# Patient Record
Sex: Male | Born: 1950 | Race: Black or African American | Hispanic: No | Marital: Married | Smoking: Never smoker
Health system: Southern US, Community
[De-identification: ages and names within clinical notes are randomized; demographics above are authoritative.]

## PROBLEM LIST (undated history)

## (undated) DIAGNOSIS — M549 Dorsalgia, unspecified: Secondary | ICD-10-CM

## (undated) DIAGNOSIS — I10 Essential (primary) hypertension: Secondary | ICD-10-CM

## (undated) DIAGNOSIS — G8929 Other chronic pain: Secondary | ICD-10-CM

## (undated) DIAGNOSIS — I82409 Acute embolism and thrombosis of unspecified deep veins of unspecified lower extremity: Secondary | ICD-10-CM

## (undated) DIAGNOSIS — M1990 Unspecified osteoarthritis, unspecified site: Secondary | ICD-10-CM

## (undated) DIAGNOSIS — I639 Cerebral infarction, unspecified: Secondary | ICD-10-CM

## (undated) HISTORY — DX: Cerebral infarction, unspecified: I63.9

## (undated) HISTORY — PX: APPENDECTOMY: SHX54

---

## 1998-11-09 ENCOUNTER — Emergency Department (HOSPITAL_COMMUNITY): Admission: EM | Admit: 1998-11-09 | Discharge: 1998-11-09 | Payer: Self-pay | Admitting: Emergency Medicine

## 1999-02-22 ENCOUNTER — Emergency Department (HOSPITAL_COMMUNITY): Admission: EM | Admit: 1999-02-22 | Discharge: 1999-02-22 | Payer: Self-pay | Admitting: Emergency Medicine

## 1999-02-25 ENCOUNTER — Emergency Department (HOSPITAL_COMMUNITY): Admission: EM | Admit: 1999-02-25 | Discharge: 1999-02-25 | Payer: Self-pay

## 1999-04-25 ENCOUNTER — Emergency Department (HOSPITAL_COMMUNITY): Admission: EM | Admit: 1999-04-25 | Discharge: 1999-04-25 | Payer: Self-pay | Admitting: Emergency Medicine

## 1999-09-07 ENCOUNTER — Emergency Department (HOSPITAL_COMMUNITY): Admission: EM | Admit: 1999-09-07 | Discharge: 1999-09-07 | Payer: Self-pay | Admitting: Emergency Medicine

## 2000-06-18 ENCOUNTER — Emergency Department (HOSPITAL_COMMUNITY): Admission: EM | Admit: 2000-06-18 | Discharge: 2000-06-18 | Payer: Self-pay | Admitting: Emergency Medicine

## 2002-07-28 ENCOUNTER — Emergency Department (HOSPITAL_COMMUNITY): Admission: EM | Admit: 2002-07-28 | Discharge: 2002-07-28 | Payer: Self-pay | Admitting: Emergency Medicine

## 2002-08-29 ENCOUNTER — Emergency Department (HOSPITAL_COMMUNITY): Admission: EM | Admit: 2002-08-29 | Discharge: 2002-08-29 | Payer: Self-pay

## 2002-09-05 ENCOUNTER — Emergency Department (HOSPITAL_COMMUNITY): Admission: EM | Admit: 2002-09-05 | Discharge: 2002-09-05 | Payer: Self-pay | Admitting: Emergency Medicine

## 2002-09-07 ENCOUNTER — Encounter: Payer: Self-pay | Admitting: Emergency Medicine

## 2002-09-07 ENCOUNTER — Emergency Department (HOSPITAL_COMMUNITY): Admission: EM | Admit: 2002-09-07 | Discharge: 2002-09-07 | Payer: Self-pay | Admitting: Emergency Medicine

## 2003-03-13 ENCOUNTER — Emergency Department (HOSPITAL_COMMUNITY): Admission: EM | Admit: 2003-03-13 | Discharge: 2003-03-13 | Payer: Self-pay | Admitting: Emergency Medicine

## 2003-05-04 ENCOUNTER — Emergency Department (HOSPITAL_COMMUNITY): Admission: EM | Admit: 2003-05-04 | Discharge: 2003-05-04 | Payer: Self-pay | Admitting: Emergency Medicine

## 2004-06-12 ENCOUNTER — Inpatient Hospital Stay (HOSPITAL_COMMUNITY): Admission: EM | Admit: 2004-06-12 | Discharge: 2004-06-15 | Payer: Self-pay | Admitting: Emergency Medicine

## 2004-06-25 ENCOUNTER — Emergency Department (HOSPITAL_COMMUNITY): Admission: EM | Admit: 2004-06-25 | Discharge: 2004-06-25 | Payer: Self-pay | Admitting: *Deleted

## 2004-10-01 ENCOUNTER — Emergency Department (HOSPITAL_COMMUNITY): Admission: EM | Admit: 2004-10-01 | Discharge: 2004-10-01 | Payer: Self-pay | Admitting: Emergency Medicine

## 2005-06-20 ENCOUNTER — Emergency Department: Payer: Self-pay | Admitting: Emergency Medicine

## 2006-01-24 ENCOUNTER — Ambulatory Visit (HOSPITAL_COMMUNITY): Admission: RE | Admit: 2006-01-24 | Discharge: 2006-01-24 | Payer: Self-pay | Admitting: *Deleted

## 2006-04-11 ENCOUNTER — Emergency Department: Payer: Self-pay | Admitting: General Practice

## 2006-11-03 ENCOUNTER — Emergency Department: Payer: Self-pay

## 2006-12-04 ENCOUNTER — Emergency Department: Payer: Self-pay | Admitting: Emergency Medicine

## 2007-08-25 ENCOUNTER — Emergency Department (HOSPITAL_COMMUNITY): Admission: EM | Admit: 2007-08-25 | Discharge: 2007-08-25 | Payer: Self-pay | Admitting: Emergency Medicine

## 2008-02-16 ENCOUNTER — Emergency Department (HOSPITAL_COMMUNITY): Admission: EM | Admit: 2008-02-16 | Discharge: 2008-02-16 | Payer: Self-pay | Admitting: Emergency Medicine

## 2008-08-07 ENCOUNTER — Emergency Department (HOSPITAL_COMMUNITY): Admission: EM | Admit: 2008-08-07 | Discharge: 2008-08-07 | Payer: Self-pay | Admitting: Emergency Medicine

## 2009-06-28 ENCOUNTER — Emergency Department (HOSPITAL_COMMUNITY): Admission: EM | Admit: 2009-06-28 | Discharge: 2009-06-28 | Payer: Self-pay | Admitting: Emergency Medicine

## 2010-07-27 ENCOUNTER — Emergency Department (HOSPITAL_COMMUNITY): Admission: EM | Admit: 2010-07-27 | Discharge: 2010-07-27 | Payer: Self-pay | Admitting: Emergency Medicine

## 2010-08-05 ENCOUNTER — Emergency Department (HOSPITAL_COMMUNITY): Admission: EM | Admit: 2010-08-05 | Discharge: 2010-08-05 | Payer: Self-pay | Admitting: Emergency Medicine

## 2011-02-11 LAB — CBC
Hemoglobin: 14.3 g/dL (ref 13.0–17.0)
MCHC: 34.5 g/dL (ref 30.0–36.0)
MCV: 91.8 fL (ref 78.0–100.0)
Platelets: 194 10*3/uL (ref 150–400)
RBC: 4.52 MIL/uL (ref 4.22–5.81)
RDW: 13 % (ref 11.5–15.5)

## 2011-02-11 LAB — DIFFERENTIAL
Basophils Absolute: 0 10*3/uL (ref 0.0–0.1)
Eosinophils Absolute: 0.1 10*3/uL (ref 0.0–0.7)
Lymphocytes Relative: 27 % (ref 12–46)
Monocytes Absolute: 0.4 10*3/uL (ref 0.1–1.0)
Monocytes Relative: 8 % (ref 3–12)
Neutrophils Relative %: 63 % (ref 43–77)

## 2011-02-11 LAB — POCT CARDIAC MARKERS

## 2011-02-11 LAB — D-DIMER, QUANTITATIVE: D-Dimer, Quant: 0.33 ug/mL-FEU (ref 0.00–0.48)

## 2011-02-11 LAB — BASIC METABOLIC PANEL
BUN: 16 mg/dL (ref 6–23)
CO2: 23 mEq/L (ref 19–32)
Creatinine, Ser: 0.98 mg/dL (ref 0.4–1.5)
Glucose, Bld: 88 mg/dL (ref 70–99)

## 2011-03-24 NOTE — Op Note (Signed)
NAME:  ANNE, SEBRING NO.:  [id number]   MEDICAL RECORD NO.:  [id number]          PATIENT TYPE:  AMB   LOCATION:  ENDO                         FACILITY:  [facility]   PHYSICIAN:  Georgiana Spinner, M.D.    DATE OF BIRTH:  Aug 15, 1951   DATE OF PROCEDURE:  01/24/2006  DATE OF DISCHARGE:                                 OPERATIVE REPORT   PROCEDURE:  Colonoscopy.   INDICATIONS:  Colon cancer screening.   ANESTHESIA:  Fentanyl 100 mcg, Versed 5 mg.   PROCEDURE:  With the patient mildly sedated in the left lateral decubitus  position, a rectal examination was performed which was unremarkable.  Subsequently, the Olympus videoscopic colonoscope was inserted into the  rectum and passed under direct vision to the cecum, identified by ileocecal  valve and appendiceal orifice -- both of which were photographed.  From this  point, the colonoscope was slowly withdrawn, taking circumferential views of  the colonic mucosa and stopping only in the rectum which appeared normal in  direct and retroflexed view. The endoscope was straightened and withdrawn.  The patient's vital signs, pulse oximetry remained stable. The patient  tolerated the procedure well without apparent complications.   FINDINGS:  Rather unremarkable colonoscopic examination to the cecum, and  negative rectal examination to my exam.   PLAN:  Consider repeat examination in 5-10 years           ______________________________  Georgiana Spinner, M.D.     GMO/MEDQ  D:  01/24/2006  T:  01/25/2006  Job:  [id number]   cc:   Fleet Contras, M.D.  Fax: [phone]

## 2011-03-24 NOTE — Discharge Summary (Signed)
NAME:  Brent Norman, Brent Norman                          ACCOUNT NO.:  [id number]   MEDICAL RECORD NO.:  [id number]                   PATIENT TYPE:  INP   LOCATION:  4540                                 FACILITY:  [facility]   PHYSICIAN:  Jackie Plum, M.D.             DATE OF BIRTH:  1951/07/29   DATE OF ADMISSION:  06/12/2004  DATE OF DISCHARGE:  06/15/2004                                 DISCHARGE SUMMARY   DISCHARGE DIAGNOSIS:  1. Left lower extremity deep vein thrombosis.     a. Doppler ultrasound of the left lower extremity done on June 12, 2004,        remarkable for deep vein thrombosis in the mid to distal calf vein.        Remaining vessels patent.  No Baker cysts.   DISCHARGE MEDICATIONS:  1. Coumadin 5 mg p.o. q.18h. until patient seen by Dr. Concepcion Elk, whereupon     dose may be adjusted based on INR.  2. Vicodin 5/500, 1 tab q.4-6h. p.r.n. pain.   The patient is to take it easy for a couple of days.  He is to avoid going  to work until after evaluation by Dr. Concepcion Elk.  He has been counseled to  read his publication on Coumadin to familiarize himself with Coumadin and  its side effects.  I have also spent some time to counsel the patient and  his wife regarding the need to avoid any activities that may involve any  traumatic injury.  Follow up appointment will be with Dr. Concepcion Elk of [facility] on Friday, June 17, 2004, at 11:45 a.m.   CONSULTANT:  Not applicable.   PROCEDURE:  Not applicable.   CONDITION ON DISCHARGE:  Improved and satisfactory.   DISCHARGE LABORATORY DATA:  WBC 4.9, hemoglobin 14.4, hematocrit 42.9, MCV  89.7, platelet count 206, pro time 20.7, INR 2.2.  Sodium 138, potassium  3.9, chloride 107, CO2 26, glucose 90, BUN 15, creatinine 1.0, calcium 9.4.   REASON FOR HOSPITALIZATION:  Left lower extremity deep vein thrombosis.  The  patient is a 60 year old gentleman with no significant previous medical  history, who came in with right calf  pain.  He had taken some pain  medication without much help and was seen at the ED at which time ultrasound  showed DVT, and he was admitted for further treatment.  On admission,  complaint of some mild chest tightness which prompted a CT scan of the chest  to be done in the ED, which came out to be negative for any PE.  The  patient's chest tightness was very transient during his hospitalization, and  he did not have any further episodes of chest tightness while  he was in the  hospital.  Please see admission H&P by Dr. Virginia Rochester, dated June 12, 2004, for full details regarding patient's presentation.   HOSPITAL COURSE:  The patient was admitted to  the hospitalist service, was  started on Lovenox with Coumadin bridge.  Also received symptomatic  treatment for his pain.  His symptoms have improved, and his INR is  therapeutic today and is deemed appropriate for discharge today for  outpatient follow up with Dr. Concepcion Elk, at which point his INR will be  checked and Coumadin be adjusted.  On admission, no work-up for  hypocoagulability was done.  It is  not clear from the history and physical the etiology of the  patient's deep  vein thrombosis and consideration may be given to hypocoagulable work-up  after patient is off anticoagulation for about 3-6 months.  He may also be  referred to hematology for evaluation at that point.                                               Jackie Plum, M.D.    GO/MEDQ  D:  06/15/2004  T:  06/15/2004  Job:  [id number]   cc:   Fleet Contras, M.D.  [address]  [place]  [place] [address]  Fax: [phone]

## 2011-03-24 NOTE — H&P (Signed)
NAME:  Brent Norman, Brent Norman                          ACCOUNT NO.:  [id number]   MEDICAL RECORD NO.:  [id number]                   PATIENT TYPE:  EMS   LOCATION:  ED                                   FACILITY:  [facility]   PHYSICIAN:  Hollice Espy, M.D.            DATE OF BIRTH:  [birth year]   DATE OF ADMISSION:  06/12/2004  DATE OF DISCHARGE:                                HISTORY & PHYSICAL   CHIEF COMPLAINT:  Leg pain, found to have a DVT.   This is a 60 year old African-American male with no past medical history,  who tells me that he was in relatively good health when his leg,  specifically his right calf, started to ache and throb, starting Thursday,  which would be approximately three days ago.  Previously he has had no  problems with this.  He also tried taking some Tylenol, but this did not  seem to alleviate the symptoms for very long.  The patient also did note  some initial what he described as mild chest tightness on Thursday morning.  He said it was difficult for him to take a full, deep breath.  Otherwise the  pain was brief, described as just very mild, approximately 3-4/10, and  quickly went away.  He has not had any previous chest pain symptoms.  The  patient's leg continued to hurt, and finally he came to the emergency room.  His vitals were all stable.  He was saturating at 98% on room air.  He had a  left knee film done in the emergency room, which was found to have positive  for evidence of a DVT.  The patient was immediately started on heparin.  His  other lab work was unremarkable.  Currently he states that his leg was  slightly bothering him, a little sore and located in the area of the left  calf, but otherwise he has no other complaints.  He has no other complaints.  He denies any headaches, visual changes, dysphagia, chest pain, shortness of  breath, wheeze, cough, abdominal pain, hematuria, dysuria, constipation, or  diarrhea.  He denies any upper extremity pain  or weakness.   PAST MEDICAL HISTORY:  None, although he did not that he did have his  appendix taken out once.  He does not follow with a regular doctor but goes  to the ER if he needs to.   MEDICATIONS:  None.   ALLERGIES:  None.   SOCIAL HISTORY:  He denies tobacco, alcohol, or drug use.  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  The patient says that he has not had any  recent episodes of trauma.  He denies any travel history by car or by plane  trips more than just a few minutes by car.  He denies being laid up  recently.  He tells me that he has run very well and is very  active.   FAMILY HISTORY:  Positive for CAD, CVA, hypertension.   PHYSICAL EXAMINATION:  VITAL SIGNS:  The patient's vitals on admission:  Temperature 97.6, heart rate 52, respirations 16, blood pressure 121/75, O2  saturation 98% on room air.  GENERAL:  He is alert and oriented x3, in no apparent distress.  HEENT:  He is normocephalic, atraumatic.  His mucous membranes are moist.  NECK:  He has no carotid bruits.  CARDIAC:  Regular rate and rhythm, S1, S2.  CHEST: Lungs clear to auscultation bilaterally.  ABDOMEN:  Soft, nontender, nondistended, positive bowel sounds.  EXTREMITIES:  No clubbing, cyanosis, or edema.  He has good 2+ pulses.  He  has a very mild Homans sign on the left, which is quite nonspecific.   The patient's lab work:  White count 4.9 with no shift, H&H 14.4 and 42.9,  MCV of 90, platelet count of 206.  Sodium 138, potassium 3.9, chloride 107,  bicarb 26, BUN 15, creatinine 1, glucose 90, calcium 9.4.  He has a PT of  12.8, INR 1, and PTT of 26.   ASSESSMENT AND PLAN:  Deep vein thrombosis.  Unsure of etiology given that  the patient has not been on any trips, no trauma, does not sit around, he  has not been off his feet, and he has no family history of blood clots.  Will check a CT to rule out pulmonary embolus, as he was complaining of  chest tightness.  Start him on the  Coumadin and Lovenox protocol.                                               Hollice Espy, M.D.    SKK/MEDQ  D:  06/12/2004  T:  06/12/2004  Job:  [id number]

## 2011-08-01 LAB — RAPID STREP SCREEN (MED CTR MEBANE ONLY): Streptococcus, Group A Screen (Direct): POSITIVE — AB

## 2011-08-07 LAB — URINE MICROSCOPIC-ADD ON

## 2011-08-07 LAB — DIFFERENTIAL
Basophils Absolute: 0
Eosinophils Relative: 0
Lymphocytes Relative: 6 — ABNORMAL LOW
Monocytes Absolute: 0.6
Monocytes Relative: 4
Neutro Abs: 12.4 — ABNORMAL HIGH

## 2011-08-07 LAB — URINALYSIS, ROUTINE W REFLEX MICROSCOPIC
Bilirubin Urine: NEGATIVE
Nitrite: NEGATIVE
Specific Gravity, Urine: 1.034 — ABNORMAL HIGH
pH: 6

## 2011-08-07 LAB — COMPREHENSIVE METABOLIC PANEL
AST: 28
Albumin: 3.7
CO2: 24
Calcium: 9.1
Potassium: 3.8
Sodium: 139
Total Protein: 6.8

## 2011-08-07 LAB — CBC
Hemoglobin: 14
MCHC: 33.8
Platelets: 167
RBC: 4.49
RDW: 13.1

## 2013-10-07 ENCOUNTER — Ambulatory Visit: Payer: Self-pay | Admitting: General Practice

## 2015-05-18 ENCOUNTER — Encounter (HOSPITAL_COMMUNITY): Payer: Self-pay | Admitting: Emergency Medicine

## 2015-05-18 ENCOUNTER — Emergency Department (HOSPITAL_COMMUNITY)
Admission: EM | Admit: 2015-05-18 | Discharge: 2015-05-18 | Disposition: A | Discharge status: Home / Self Care | Payer: BLUE CROSS/BLUE SHIELD | Attending: Emergency Medicine | Admitting: Emergency Medicine

## 2015-05-18 DIAGNOSIS — M545 Low back pain: Secondary | ICD-10-CM | POA: Diagnosis not present

## 2015-05-18 DIAGNOSIS — M79605 Pain in left leg: Secondary | ICD-10-CM | POA: Insufficient documentation

## 2015-05-18 DIAGNOSIS — G8929 Other chronic pain: Secondary | ICD-10-CM | POA: Insufficient documentation

## 2015-05-18 DIAGNOSIS — Z79899 Other long term (current) drug therapy: Secondary | ICD-10-CM | POA: Insufficient documentation

## 2015-05-18 HISTORY — DX: Dorsalgia, unspecified: M54.9

## 2015-05-18 HISTORY — DX: Other chronic pain: G89.29

## 2015-05-18 MED ORDER — HYDROMORPHONE HCL 1 MG/ML IJ SOLN
1.0000 mg | Freq: Once | INTRAMUSCULAR | Status: AC
  Administered 2015-05-18: 1 mg via INTRAMUSCULAR
  Filled 2015-05-18: qty 1

## 2015-05-18 MED ORDER — HYDROCODONE-ACETAMINOPHEN 5-325 MG PO TABS: 1.0000 | ORAL_TABLET | Freq: Four times a day (QID) | ORAL | Status: DC | PRN

## 2015-05-18 MED ORDER — CYCLOBENZAPRINE HCL 5 MG PO TABS
5.0000 mg | ORAL_TABLET | Freq: Three times a day (TID) | ORAL | Status: DC | PRN
Start: 2015-05-18 — End: 2015-05-21

## 2015-05-18 NOTE — ED Notes (Signed)
Awake. Verbally responsive. A/O x4. Resp even and unlabored. No audible adventitious breath sounds noted. ABC's intact.  

## 2015-05-18 NOTE — ED Provider Notes (Signed)
CSN: [id number]     Arrival date & time 05/18/15  1002 History   First MD Initiated Contact with Patient 05/18/15 1011     Chief Complaint  Patient presents with  . Back Pain  . Leg Pain     Patient is a 64 y.o. male presenting with back pain and leg pain. The history is provided by the patient.  Back Pain Associated symptoms: leg pain   Leg Pain Associated symptoms: back pain    Mr. Hoon presents for evaluation of acute on chronic back and leg pain. He states that he has a history of chronic low back and left-sided back pain. Over the last 4 days he's had increased pain described as aching in nature. Pain is worse with twisting and ambulating. He has chronic tingling that radiates from his back down to his knee. It has been more intense sensation over the last couple of days. He denies any new injuries. Denies any fevers, abdominal pain, vomiting, dysuria, urinary incontinence. He has been unable to get out of bed for the last 2 days due to pain in his back. Symptoms are moderate, constant, worsening. He is taking naproxen without improvement.  Past Medical History  Diagnosis Date  . Chronic back pain    No past surgical history on file. No family history on file. History  Substance Use Topics  . Smoking status: Never Smoker   . Smokeless tobacco: Not on file  . Alcohol Use: No    Review of Systems  Musculoskeletal: Positive for back pain.  All other systems reviewed and are negative.     Allergies  Review of patient's allergies indicates no known allergies.  Home Medications   Prior to Admission medications   Medication Sig Start Date End Date Taking? Authorizing Provider  cyclobenzaprine (FLEXERIL) 5 MG tablet Take 1 tablet (5 mg total) by mouth 3 (three) times daily as needed for muscle spasms. 05/18/15   Tilden Fossa, MD  HYDROcodone-acetaminophen (NORCO/VICODIN) 5-325 MG per tablet Take 1 tablet by mouth every 6 (six) hours as needed. 05/18/15   Tilden Fossa, MD   hydrOXYzine (ATARAX/VISTARIL) 50 MG tablet Take 50 mg by mouth 2 (two) times daily. 02/12/15   Historical Provider, MD  naproxen (NAPRELAN) 500 MG 24 hr tablet Take 500 mg by mouth 2 (two) times daily as needed. 03/05/15   Historical Provider, MD   BP 146/82 mmHg  Pulse 60  Temp(Src) 98.1 F (36.7 C) (Oral)  Resp 20  SpO2 98% Physical Exam  Constitutional: He is oriented to person, place, and time. He appears well-developed and well-nourished.  HENT:  Head: Normocephalic and atraumatic.  Cardiovascular: Normal rate and regular rhythm.   No murmur heard. Pulmonary/Chest: Effort normal and breath sounds normal. No respiratory distress.  Abdominal: Soft. There is no tenderness. There is no rebound and no guarding.  Musculoskeletal: He exhibits no edema.  Tender to palpation over the left lower back, SI region. There is no midline lumbar or thoracic tenderness to palpation. No overlying rashes. Pain in the low back is reproducible with twisting and flexion of the hip. 2+ DP pulses bilaterally.  Neurological: He is alert and oriented to person, place, and time.  5/5 strength in bilateral lower extremities. Sensation light touch intact throughout bilateral lower extremities. No saddle anesthesia.  Skin: Skin is warm and dry.  Psychiatric: He has a normal mood and affect. His behavior is normal.  Nursing note and vitals reviewed.   ED Course  Procedures (including critical  care time) Labs Review Labs Reviewed - No data to display  Imaging Review No results found.   EKG Interpretation None      MDM   Final diagnoses:  Left low back pain, with sciatica presence unspecified    Pt here for evaluation of acute on chronic low back pain.  Pt with radicular sxs but no neurologic deficit on exam.  No evidence of cauda equina, acute infectious process, dissection, AAA.  Discussed home care with pain control, PCP followup, return precautions.     Tilden FossaElizabeth Kiira Brach, MD 05/18/15 1046

## 2015-05-18 NOTE — ED Notes (Signed)
Pt reported mid-back pain radiating to lt lateral leg to foot. Pt reported difficulty with weight bearing to lt leg. Denies dysuria. (+)PMS, CRT brisk, no trauma/injury/deformity or bruising noted.

## 2015-05-18 NOTE — Discharge Instructions (Signed)
Back Pain, Adult Low back pain is very common. About 1 in 5 people have back pain.The cause of low back pain is rarely dangerous. The pain often gets better over time.About half of people with a sudden onset of back pain feel better in just 2 weeks. About 8 in 10 people feel better by 6 weeks.  CAUSES Some common causes of back pain include:  Strain of the muscles or ligaments supporting the spine.  Wear and tear (degeneration) of the spinal discs.  Arthritis.  Direct injury to the back. DIAGNOSIS Most of the time, the direct cause of low back pain is not known.However, back pain can be treated effectively even when the exact cause of the pain is unknown.Answering your caregiver's questions about your overall health and symptoms is one of the most accurate ways to make sure the cause of your pain is not dangerous. If your caregiver needs more information, he or she may order lab work or imaging tests (X-rays or MRIs).However, even if imaging tests show changes in your back, this usually does not require surgery. HOME CARE INSTRUCTIONS For many people, back pain returns.Since low back pain is rarely dangerous, it is often a condition that people can learn to manageon their own.   Remain active. It is stressful on the back to sit or stand in one place. Do not sit, drive, or stand in one place for more than 30 minutes at a time. Take short walks on level surfaces as soon as pain allows.Try to increase the length of time you walk each day.  Do not stay in bed.Resting more than 1 or 2 days can delay your recovery.  Do not avoid exercise or work.Your body is made to move.It is not dangerous to be active, even though your back may hurt.Your back will likely heal faster if you return to being active before your pain is gone.  Pay attention to your body when you bend and lift. Many people have less discomfortwhen lifting if they bend their knees, keep the load close to their bodies,and  avoid twisting. Often, the most comfortable positions are those that put less stress on your recovering back.  Find a comfortable position to sleep. Use a firm mattress and lie on your side with your knees slightly bent. If you lie on your back, put a pillow under your knees.  Only take over-the-counter or prescription medicines as directed by your caregiver. Over-the-counter medicines to reduce pain and inflammation are often the most helpful.Your caregiver may prescribe muscle relaxant drugs.These medicines help dull your pain so you can more quickly return to your normal activities and healthy exercise.  Put ice on the injured area.  Put ice in a plastic bag.  Place a towel between your skin and the bag.  Leave the ice on for 15-20 minutes, 03-04 times a day for the first 2 to 3 days. After that, ice and heat may be alternated to reduce pain and spasms.  Ask your caregiver about trying back exercises and gentle massage. This may be of some benefit.  Avoid feeling anxious or stressed.Stress increases muscle tension and can worsen back pain.It is important to recognize when you are anxious or stressed and learn ways to manage it.Exercise is a great option. SEEK MEDICAL CARE IF:  You have pain that is not relieved with rest or medicine.  You have pain that does not improve in 1 week.  You have new symptoms.  You are generally not feeling well. SEEK   IMMEDIATE MEDICAL CARE IF:   You have pain that radiates from your back into your legs.  You develop new bowel or bladder control problems.  You have unusual weakness or numbness in your arms or legs.  You develop nausea or vomiting.  You develop abdominal pain.  You feel faint. Document Released: 10/23/2005 Document Revised: 04/23/2012 Document Reviewed: 02/24/2014 ExitCare Patient Information 2015 [employer]. This information is not intended to replace advice given to you by your health care provider. Make sure you  discuss any questions you have with your health care provider.  

## 2015-05-18 NOTE — ED Notes (Signed)
Pt BIB wife.  Pt states that he has chronic back problems and they started "flaring up" on Sunday.  Pt states that since then, he has had burning and tingling from his lt hip down to his toes that he states makes it difficult to walk.  Pt states "this isn't my back problem! Something else is wrong with me!"

## 2015-05-18 NOTE — ED Notes (Signed)
Pt had no reaction from IM injection.

## 2015-05-21 ENCOUNTER — Encounter (HOSPITAL_COMMUNITY): Payer: Self-pay | Admitting: Emergency Medicine

## 2015-05-21 ENCOUNTER — Emergency Department (HOSPITAL_COMMUNITY)
Admission: EM | Admit: 2015-05-21 | Discharge: 2015-05-21 | Disposition: A | Discharge status: Home / Self Care | Payer: BLUE CROSS/BLUE SHIELD | Attending: Emergency Medicine | Admitting: Emergency Medicine

## 2015-05-21 DIAGNOSIS — M5442 Lumbago with sciatica, left side: Secondary | ICD-10-CM | POA: Insufficient documentation

## 2015-05-21 DIAGNOSIS — Z79899 Other long term (current) drug therapy: Secondary | ICD-10-CM | POA: Insufficient documentation

## 2015-05-21 DIAGNOSIS — G8929 Other chronic pain: Secondary | ICD-10-CM | POA: Insufficient documentation

## 2015-05-21 DIAGNOSIS — M545 Low back pain: Secondary | ICD-10-CM | POA: Diagnosis present

## 2015-05-21 MED ORDER — HYDROCODONE-ACETAMINOPHEN 5-325 MG PO TABS: 1.0000 | ORAL_TABLET | ORAL | Status: DC | PRN

## 2015-05-21 MED ORDER — HYDROCODONE-ACETAMINOPHEN 5-325 MG PO TABS: 1.0000 | ORAL_TABLET | Freq: Four times a day (QID) | ORAL | Status: DC | PRN

## 2015-05-21 MED ORDER — CYCLOBENZAPRINE HCL 5 MG PO TABS: 5.0000 mg | ORAL_TABLET | Freq: Three times a day (TID) | ORAL | Status: DC | PRN

## 2015-05-21 NOTE — ED Notes (Signed)
Pt A+ox4, reports L lower back pain x1 week ago.  Seen here in ED x3 days ago and given medications.  Pt reports "a little" improvement with medications.  Pt continues to report "needles" in L low back.  Reports pain shooting into LLE.  Pt denies injury.  Ambulatory with slow steady gait.  Pt denies n/t to extremities.  Denies b/b changes or complaints.  Skin PWD.  MAEI.  Speakign full/clear sentences, rr even/un-lab.  NAD.

## 2015-05-21 NOTE — ED Provider Notes (Signed)
CSN: [id number]     Arrival date & time 05/21/15  1116 History   First MD Initiated Contact with Patient 05/21/15 1119     Chief Complaint  Patient presents with  . Back Pain    L lower back pain, seen here in ED for same x3 days ago, shooting down LLE     (Consider location/radiation/quality/duration/timing/severity/associated sxs/prior Treatment) HPI Comments: Patient presents today with lower back pain.  He reports that the pain has been present for the past week.  Pain radiates down the left leg.  He denies any acute injury or trauma.  Pain worse with ambulation and movement.  He was seen in the ED three days ago for the same.  At that he was given Rx for Flexeril and Norco.  He reports that he has been taking the medications as prescribed, which has helped.  He reports that he attempted to get an appointment with PCP, but was unable to get an appointment this week because his PCP was on vacation.  He states that he can get an appointment next week.  He denies bowel/bladder incontinence, fever, chills, saddle anaesthesia.  He does report chronic numbness of the LLE.    The history is provided by the patient.    Past Medical History  Diagnosis Date  . Chronic back pain    Past Surgical History  Procedure Laterality Date  . Appendectomy     No family history on file. History  Substance Use Topics  . Smoking status: Never Smoker   . Smokeless tobacco: Not on file  . Alcohol Use: No    Review of Systems  All other systems reviewed and are negative.     Allergies  Review of patient's allergies indicates no known allergies.  Home Medications   Prior to Admission medications   Medication Sig Start Date End Date Taking? Authorizing Provider  cyclobenzaprine (FLEXERIL) 5 MG tablet Take 1 tablet (5 mg total) by mouth 3 (three) times daily as needed for muscle spasms. 05/21/15   Santiago Glad, PA-C  HYDROcodone-acetaminophen (NORCO/VICODIN) 5-325 MG per tablet Take 1-2 tablets  by mouth every 4 (four) hours as needed. 05/21/15   Jomes Giraldo, PA-C  hydrOXYzine (ATARAX/VISTARIL) 50 MG tablet Take 50 mg by mouth 2 (two) times daily. 02/12/15   Historical Provider, MD  Menthol, Topical Analgesic, (BENGAY EX) Apply 1 application topically daily as needed. Pain    Historical Provider, MD  naproxen (NAPRELAN) 500 MG 24 hr tablet Take 500 mg by mouth 2 (two) times daily as needed.  03/05/15   Historical Provider, MD   BP 165/89 mmHg  Pulse 78  Temp(Src) 98.1 F (36.7 C) (Oral)  Resp 18  Ht  (1.626 m)  Wt 197 lb (89.359 kg)  BMI 33.80 kg/m2  SpO2 100% Physical Exam  Constitutional: He appears well-developed and well-nourished.  HENT:  Head: Normocephalic and atraumatic.  Neck: Normal range of motion. Neck supple.  Cardiovascular: Normal rate, regular rhythm and normal heart sounds.   Pulmonary/Chest: Effort normal and breath sounds normal.  Musculoskeletal: Normal range of motion.  Neurological: He is alert. He has normal strength. No sensory deficit. Gait normal.  Reflex Scores:      Patellar reflexes are 2+ on the right side and 2+ on the left side. Sensation of both feet intact   Skin: Skin is warm and dry.  Psychiatric: He has a normal mood and affect.  Nursing note and vitals reviewed.   ED Course  Procedures (including  critical care time) Labs Review Labs Reviewed - No data to display  Imaging Review No results found.   EKG Interpretation None      MDM   Final diagnoses:  Left-sided low back pain with left-sided sciatica   Patient with back pain.  No neurological deficits and normal neuro exam.  Patient can walk but states is painful.  No loss of bowel or bladder control.  No concern for cauda equina.  No fever, night sweats, weight loss, h/o cancer, IVDU.  RICE protocol and pain medicine indicated and discussed with patient.  Patient stable for discharge.  Return precautions given.       Santiago GladHeather Alwyn Cordner, PA-C 05/21/15  2135  Blane OharaJoshua Zavitz, MD 05/22/15 [id number]

## 2015-06-10 ENCOUNTER — Ambulatory Visit: Payer: BLUE CROSS/BLUE SHIELD | Admitting: Family Medicine

## 2015-06-10 DIAGNOSIS — Z0289 Encounter for other administrative examinations: Secondary | ICD-10-CM

## 2015-08-10 ENCOUNTER — Ambulatory Visit (HOSPITAL_COMMUNITY)
Admission: RE | Admit: 2015-08-10 | Discharge: 2015-08-10 | Disposition: A | Discharge status: Home / Self Care | Payer: BLUE CROSS/BLUE SHIELD | Source: Ambulatory Visit | Attending: Internal Medicine | Admitting: Internal Medicine

## 2015-08-10 ENCOUNTER — Other Ambulatory Visit (HOSPITAL_COMMUNITY): Payer: Self-pay | Admitting: Internal Medicine

## 2015-08-10 DIAGNOSIS — M79661 Pain in right lower leg: Secondary | ICD-10-CM | POA: Insufficient documentation

## 2015-08-10 DIAGNOSIS — L03115 Cellulitis of right lower limb: Secondary | ICD-10-CM

## 2015-08-10 DIAGNOSIS — M7989 Other specified soft tissue disorders: Secondary | ICD-10-CM | POA: Insufficient documentation

## 2015-08-10 NOTE — Progress Notes (Signed)
Preliminary results by tech - Right Lower Ext. Venous Duplex Completed. Negative for deep and superficial vein thrombosis in the right lower extremity. Camber Ninh, BS, RDMS, RVT  

## 2016-03-07 DIAGNOSIS — Z713 Dietary counseling and surveillance: Secondary | ICD-10-CM | POA: Diagnosis not present

## 2016-03-16 ENCOUNTER — Encounter: Payer: Self-pay | Admitting: Physician Assistant

## 2016-03-16 ENCOUNTER — Ambulatory Visit: Payer: Self-pay | Admitting: Physician Assistant

## 2016-03-16 VITALS — BP 110/79 | HR 65 | Temp 97.8°F

## 2016-03-16 DIAGNOSIS — L0390 Cellulitis, unspecified: Secondary | ICD-10-CM

## 2016-03-16 MED ORDER — MUPIROCIN 2 % EX OINT: TOPICAL_OINTMENT | CUTANEOUS | Status: DC

## 2016-03-16 NOTE — Progress Notes (Signed)
S: c/o red sore on toe, no known injury, no spider bite, no fever/chills, no hx of gout, noticed area about 3 days ago, is itchy  O: vitals wnl, nad, skin with small open wound smaller than a pea; clear drainage, n/v intact  A: wound, ?insect bite  P: bactroban ointment, otc hydrocortisone cream

## 2016-08-10 ENCOUNTER — Ambulatory Visit: Payer: Self-pay | Admitting: Physician Assistant

## 2016-08-10 ENCOUNTER — Encounter: Payer: Self-pay | Admitting: Physician Assistant

## 2016-08-10 VITALS — BP 119/65 | HR 62 | Temp 98.3°F

## 2016-08-10 DIAGNOSIS — S76012A Strain of muscle, fascia and tendon of left hip, initial encounter: Secondary | ICD-10-CM

## 2016-08-10 NOTE — Progress Notes (Signed)
   Subjective:Hip pain    Patient ID: Brent MerleDavid M Norman, male    DOB: [birth year], 65 y.o.   MRN: [id number]  HPI Patient c/o left hip pain for 3 days. No provocative incident for compliant. Patient states pain increase with extension of left leg. Patient describes the pain as "thobbing". No palliative measures for compliant. Rates pain as 7/10.   Review of Systems Negative except for compliant.    Objective:   Physical Exam No obvious deformity of hip. No leg length discrepancy. Moderate guarding with left iliac crest.        Assessment & Plan: Strain left hip  Trial of flexeril and Mobic.  Follow 3-5 days if no improvement.

## 2016-09-04 DIAGNOSIS — Z131 Encounter for screening for diabetes mellitus: Secondary | ICD-10-CM | POA: Diagnosis not present

## 2016-09-04 DIAGNOSIS — J302 Other seasonal allergic rhinitis: Secondary | ICD-10-CM | POA: Diagnosis not present

## 2016-09-04 DIAGNOSIS — Z23 Encounter for immunization: Secondary | ICD-10-CM | POA: Diagnosis not present

## 2016-09-04 DIAGNOSIS — Z125 Encounter for screening for malignant neoplasm of prostate: Secondary | ICD-10-CM | POA: Diagnosis not present

## 2016-09-04 DIAGNOSIS — E784 Other hyperlipidemia: Secondary | ICD-10-CM | POA: Diagnosis not present

## 2016-09-04 DIAGNOSIS — L509 Urticaria, unspecified: Secondary | ICD-10-CM | POA: Diagnosis not present

## 2016-12-03 ENCOUNTER — Observation Stay (HOSPITAL_COMMUNITY)
Admission: EM | Admit: 2016-12-03 | Discharge: 2016-12-04 | Disposition: A | Discharge status: Home / Self Care | Payer: BLUE CROSS/BLUE SHIELD | Attending: Internal Medicine | Admitting: Internal Medicine

## 2016-12-03 ENCOUNTER — Emergency Department (HOSPITAL_COMMUNITY): Payer: BLUE CROSS/BLUE SHIELD

## 2016-12-03 ENCOUNTER — Emergency Department (HOSPITAL_BASED_OUTPATIENT_CLINIC_OR_DEPARTMENT_OTHER)
Admission: EM | Admit: 2016-12-03 | Discharge: 2016-12-03 | Disposition: A | Discharge status: Home / Self Care | Payer: BLUE CROSS/BLUE SHIELD | Source: Home / Self Care | Attending: Emergency Medicine | Admitting: Emergency Medicine

## 2016-12-03 ENCOUNTER — Encounter (HOSPITAL_COMMUNITY): Payer: Self-pay | Admitting: Emergency Medicine

## 2016-12-03 DIAGNOSIS — Z79899 Other long term (current) drug therapy: Secondary | ICD-10-CM | POA: Insufficient documentation

## 2016-12-03 DIAGNOSIS — M549 Dorsalgia, unspecified: Secondary | ICD-10-CM | POA: Insufficient documentation

## 2016-12-03 DIAGNOSIS — Z86718 Personal history of other venous thrombosis and embolism: Secondary | ICD-10-CM | POA: Diagnosis not present

## 2016-12-03 DIAGNOSIS — G8929 Other chronic pain: Secondary | ICD-10-CM | POA: Diagnosis not present

## 2016-12-03 DIAGNOSIS — Z87891 Personal history of nicotine dependence: Secondary | ICD-10-CM | POA: Insufficient documentation

## 2016-12-03 DIAGNOSIS — R55 Syncope and collapse: Principal | ICD-10-CM | POA: Diagnosis present

## 2016-12-03 DIAGNOSIS — M79609 Pain in unspecified limb: Secondary | ICD-10-CM

## 2016-12-03 HISTORY — DX: Acute embolism and thrombosis of unspecified deep veins of unspecified lower extremity: I82.409

## 2016-12-03 HISTORY — DX: Unspecified osteoarthritis, unspecified site: M19.90

## 2016-12-03 LAB — I-STAT TROPONIN, ED: Troponin i, poc: 0 ng/mL (ref 0.00–0.08)

## 2016-12-03 LAB — CBC
HCT: 41.6 % (ref 39.0–52.0)
Hemoglobin: 14.3 g/dL (ref 13.0–17.0)
MCH: 30 pg (ref 26.0–34.0)
MCHC: 34.4 g/dL (ref 30.0–36.0)
MCV: 87.2 fL (ref 78.0–100.0)
PLATELETS: 226 10*3/uL (ref 150–400)
RBC: 4.77 MIL/uL (ref 4.22–5.81)
RDW: 12.8 % (ref 11.5–15.5)
WBC: 5.9 10*3/uL (ref 4.0–10.5)

## 2016-12-03 LAB — BASIC METABOLIC PANEL
Anion gap: 8 (ref 5–15)
BUN: 20 mg/dL (ref 6–20)
CALCIUM: 9.3 mg/dL (ref 8.9–10.3)
CO2: 22 mmol/L (ref 22–32)
Chloride: 107 mmol/L (ref 101–111)
Creatinine, Ser: 1.24 mg/dL (ref 0.61–1.24)
Glucose, Bld: 88 mg/dL (ref 65–99)
Potassium: 4.1 mmol/L (ref 3.5–5.1)
SODIUM: 137 mmol/L (ref 135–145)

## 2016-12-03 LAB — CBG MONITORING, ED
GLUCOSE-CAPILLARY: 82 mg/dL (ref 65–99)
Glucose-Capillary: 106 mg/dL — ABNORMAL HIGH (ref 65–99)

## 2016-12-03 LAB — URINALYSIS, ROUTINE W REFLEX MICROSCOPIC
Bilirubin Urine: NEGATIVE
Glucose, UA: NEGATIVE mg/dL
HGB URINE DIPSTICK: NEGATIVE
Ketones, ur: NEGATIVE mg/dL
LEUKOCYTES UA: NEGATIVE
Nitrite: NEGATIVE
PROTEIN: NEGATIVE mg/dL
Specific Gravity, Urine: 1.004 — ABNORMAL LOW (ref 1.005–1.030)
pH: 6 (ref 5.0–8.0)

## 2016-12-03 MED ORDER — SODIUM CHLORIDE 0.9% FLUSH
3.0000 mL | Freq: Two times a day (BID) | INTRAVENOUS | Status: DC
  Administered 2016-12-03 – 2016-12-04 (×2): 3 mL via INTRAVENOUS

## 2016-12-03 MED ORDER — SODIUM CHLORIDE 0.9 % IV BOLUS (SEPSIS)
500.0000 mL | Freq: Once | INTRAVENOUS | Status: AC
  Administered 2016-12-03: 500 mL via INTRAVENOUS

## 2016-12-03 MED ORDER — IOPAMIDOL (ISOVUE-370) INJECTION 76%
100.0000 mL | Freq: Once | INTRAVENOUS | Status: AC | PRN
  Administered 2016-12-03: 100 mL via INTRAVENOUS

## 2016-12-03 MED ORDER — ENOXAPARIN SODIUM 40 MG/0.4ML ~~LOC~~ SOLN
40.0000 mg | Freq: Every day | SUBCUTANEOUS | Status: DC
  Administered 2016-12-03 – 2016-12-04 (×2): 40 mg via SUBCUTANEOUS
  Filled 2016-12-03 (×2): qty 0.4

## 2016-12-03 MED ORDER — LORATADINE 10 MG PO TABS
10.0000 mg | ORAL_TABLET | Freq: Every day | ORAL | Status: DC
  Administered 2016-12-03 – 2016-12-04 (×2): 10 mg via ORAL
  Filled 2016-12-03 (×2): qty 1

## 2016-12-03 MED ORDER — IOPAMIDOL (ISOVUE-370) INJECTION 76%
INTRAVENOUS | Status: AC
  Filled 2016-12-03: qty 100

## 2016-12-03 MED ORDER — ACETAMINOPHEN 325 MG PO TABS: 650.0000 mg | ORAL_TABLET | Freq: Four times a day (QID) | ORAL | Status: DC | PRN

## 2016-12-03 MED ORDER — HYDROXYZINE HCL 25 MG PO TABS
25.0000 mg | ORAL_TABLET | Freq: Two times a day (BID) | ORAL | Status: DC | PRN
Start: 2016-12-03 — End: 2016-12-04

## 2016-12-03 NOTE — Progress Notes (Signed)
Pt arrived to unit from ED via stretcher. Stood and ambulated to bed w/ CG assist. VSS. Pt and wife oriented to callbell and environment.  POC discussed. Tele box 16 applied and confirmed w/ CMT.

## 2016-12-03 NOTE — ED Provider Notes (Signed)
Emergency Department Provider Note   I have reviewed the triage vital signs and the nursing notes.   HISTORY  Chief Complaint Loss of Consciousness   HPI Brent Norman is a 66 y.o. male with PMH of chronic back pain, DVT, and arthritis presents to the emergency department for evaluation of syncope. Patient states that he was waiting for breakfast when he suddenly passed out. He does not recall any preceding symptoms. He states he woke up on the floor and the syncope was not witnessed by bystanders. He got up under his own power and approximately 6 minutes later had a second near syncopal episode. He states that during that he went down to his knees and struck his hand against his left scalp causing a slight abrasion. He denies any difficulty breathing or palpitations. He does have a history of DVT and has noticed some increased pain in his left calf. He also noted some intermittent left chest pain over the past 2 weeks that is worse with deep breathing. Pain did not worsen today.   Past Medical History:  Diagnosis Date  . Arthritis   . Chronic back pain   . DVT (deep venous thrombosis) [facility])     Patient Active Problem List   Diagnosis Date Noted  . Syncope 12/03/2016    Past Surgical History:  Procedure Laterality Date  . APPENDECTOMY        Allergies Patient has no known allergies.  History reviewed. No pertinent family history.  Social History Social History  Substance Use Topics  . Smoking status: Former Games developer  . Smokeless tobacco: Never Used  . Alcohol use No    Review of Systems  Constitutional: No fever/chills Eyes: No visual changes. ENT: No sore throat. Cardiovascular: Denies chest pain. Positive syncope.  Respiratory: Denies shortness of breath. Gastrointestinal: No abdominal pain.  No nausea, no vomiting.  No diarrhea.  No constipation. Genitourinary: Negative for dysuria. Musculoskeletal: Negative for back pain. Skin: Negative for  rash. Neurological: Negative for headaches, focal weakness or numbness.  10-point ROS otherwise negative.  ____________________________________________   PHYSICAL EXAM:  VITAL SIGNS: ED Triage Vitals  Enc Vitals Group     BP 12/03/16 0849 147/92     Pulse Rate 12/03/16 0849 70     Resp 12/03/16 0849 16     Temp 12/03/16 0849 98.5 F (36.9 C)     Temp Source 12/03/16 0849 Oral     SpO2 12/03/16 0849 100 %     Pain Score 12/03/16 0851 2   Constitutional: Alert and oriented. Well appearing and in no acute distress. Eyes: Conjunctivae are normal. PERRL. Head: Atraumatic. Nose: No congestion/rhinnorhea. Mouth/Throat: Mucous membranes are dry. Oropharynx non-erythematous. Neck: No stridor.  Cardiovascular: Normal rate, regular rhythm. Good peripheral circulation. Grossly normal heart sounds.   Respiratory: Normal respiratory effort.  No retractions. Lungs CTAB. Gastrointestinal: Soft and nontender. No distention.  Musculoskeletal: No lower extremity tenderness nor edema. No gross deformities of extremities. Neurologic:  Normal speech and language. No gross focal neurologic deficits are appreciated.  Skin:  Skin is warm, dry and intact. No rash noted. Psychiatric: Mood and affect are normal. Speech and behavior are normal.  ____________________________________________   LABS (all labs ordered are listed, but only abnormal results are displayed)  Labs Reviewed  URINALYSIS, ROUTINE W REFLEX MICROSCOPIC - Abnormal; Notable for the following:       Result Value   Specific Gravity, Urine 1.004 (*)    All other components within normal limits  CBG MONITORING, ED - Abnormal; Notable for the following:    Glucose-Capillary 106 (*)    All other components within normal limits  BASIC METABOLIC PANEL  CBC  CBG MONITORING, ED  I-STAT TROPOININ, ED   ____________________________________________  EKG   EKG Interpretation  Date/Time:  Sunday December 03 2016 08:51:14  EST Ventricular Rate:  67 PR Interval:    QRS Duration: 91 QT Interval:  412 QTC Calculation: 435 R Axis:   -34 Text Interpretation:  Sinus rhythm Left axis deviation Nonspecific T abnormalities, inferior leads Minimal ST elevation, anterior leads Similar to prior. No STEMI.  Confirmed by LONG MD, JOSHUA [phone]) on 12/03/2016 9:13:50 AM Also confirmed by LONG MD, JOSHUA [phone]), editor Stout CT, Jola BabinskiMarilyn [phone]([id number])  on 12/03/2016 11:06:39 AM       ____________________________________________  RADIOLOGY  Ct Angio Chest Pe W And/or Wo Contrast  Result Date: 12/03/2016 CLINICAL DATA:  Syncope. EXAM: CT ANGIOGRAPHY CHEST WITH CONTRAST TECHNIQUE: Multidetector CT imaging of the chest was performed using the standard protocol during bolus administration of intravenous contrast. Multiplanar CT image reconstructions and MIPs were obtained to evaluate the vascular anatomy. CONTRAST:  100 cc Isovue 370 IV COMPARISON:  06/12/2004 FINDINGS: Cardiovascular: Heart is normal size. Aorta is normal caliber. No filling defects in the pulmonary arteries to suggest pulmonary emboli. Mediastinum/Nodes: No mediastinal, hilar, or axillary adenopathy. Lungs/Pleura: Lungs are clear. No focal airspace opacities or suspicious nodules. No effusions. Upper Abdomen: Imaging into the upper abdomen shows no acute findings. Musculoskeletal: Chest wall soft tissues are unremarkable. No acute bony abnormality or focal bone lesion. Review of the MIP images confirms the above findings. IMPRESSION: No evidence of pulmonary embolus.  No acute cardiopulmonary disease. Electronically Signed   By: Charlett NoseKevin  Dover M.D.   On: 12/03/2016 10:42    ____________________________________________   PROCEDURES  Procedure(s) performed:   Procedures   ____________________________________________   INITIAL IMPRESSION / ASSESSMENT AND PLAN / ED COURSE  Pertinent labs & imaging results that were available during my care of the patient were  reviewed by me and considered in my medical decision making (see chart for details).  Patient resents to the emergency department for evaluation of syncope and near syncope. Patient with history of DVT worsening pain in the left calf and intermittent left-sided chest discomfort is worse with deep breathing. Patient vital signs are normal initial labs are reassuring. We'll obtain troponin and sent for ultrasound of the left lower extremity along with CT scan of the chest to evaluate for pulmonary embolism  Discussed patient's case with hospitalist, Dr. Jarvis NewcomerGrunz.  Recommend admission to tele, obs bed.  I will place holding orders per their request. Patient and family (if present) updated with plan. Care transferred to hospitalist service.  I reviewed all nursing notes, vitals, pertinent old records, EKGs, labs, imaging (as available).  ____________________________________________  FINAL CLINICAL IMPRESSION(S) / ED DIAGNOSES  Final diagnoses:  Syncope, unspecified syncope type     MEDICATIONS GIVEN DURING THIS VISIT:  Medications  iopamidol (ISOVUE-370) 76 % injection (not administered)  hydrOXYzine (ATARAX/VISTARIL) tablet 25 mg (not administered)  loratadine (CLARITIN) tablet 10 mg (10 mg Oral Given 12/03/16 1500)  sodium chloride flush (NS) 0.9 % injection 3 mL (3 mLs Intravenous Not Given 12/03/16 1400)  enoxaparin (LOVENOX) injection 40 mg (40 mg Subcutaneous Given 12/03/16 1500)  acetaminophen (TYLENOL) tablet 650 mg (not administered)  sodium chloride 0.9 % bolus 500 mL (0 mLs Intravenous Stopped 12/03/16 1025)  iopamidol (ISOVUE-370) 76 % injection 100 mL (  100 mLs Intravenous Contrast Given 12/03/16 1030)     NEW OUTPATIENT MEDICATIONS STARTED DURING THIS VISIT:  None   Note:  This document was prepared using Dragon voice recognition software and may include unintentional dictation errors.  Alona Bene, MD Emergency Medicine   Maia Plan, MD 12/03/16 [phone]

## 2016-12-03 NOTE — Progress Notes (Signed)
*  PRELIMINARY RESULTS* Vascular Ultrasound Left lower extremity venous duplex has been completed.  Preliminary findings: No evidence of DVT or baker's cyst.   Farrel DemarkJill Eunice, RDMS, RVT  12/03/2016, 10:20 AM

## 2016-12-03 NOTE — ED Notes (Signed)
Vascular ultra sound at bedside.

## 2016-12-03 NOTE — H&P (Signed)
History and Physical   Brent MerleDavid M Pereda GNF:[id number] DOB: 1951/01/02 DOA: 12/03/2016  Referring MD/NP/PA: Dr. Jacqulyn BathLong, EDP PCP: [facility] Patient coming from: Home  Chief Complaint: Transient loss of consciousness  HPI: Brent Norman is a 66 y.o. 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 The night prior to presentation he reports he was sitting on a window sill in a take out restaurant waiting for his dinner order when the next thing he knew he was laying on the floor. There were no witnesses. He denies any preceding symptoms and doesn't know how long he was down for. He got up and walked outside for fresh air and felt light headed, nearly losing consciousness again but caught himself on a trash can. He believes he scraped his head during this near fall, but felt well enough to get dinner, drive home, and didn't believe these symptoms were concerning until his wife made him come into the ED this morning. He denies any preceding symptoms, but when pressed he says the top of his head was warm and when he came to on the ground his head felt sweaty. He was never disoriented or weak feeling. He was not having chest pain, dyspnea, palpitations, or vertigo. He reports chronic left leg swelling and endorsed mild intermittent pleuritic chest pain not changed with exertion that he has had for several weeks. Had a DVT treated with anticoagulation (can't recall the drug) for 6 months about 3 years ago.   ED Course: On arrival he was in no distress, afebrile, BP 147/92, HR 70bpm, orthostatic vital signs subsequently checked were negative. CBC and BMP were normal. UA negative. Troponin negative. CTA chest was negative for PE and LE doppler was negative for DVT. ECG showed NSR. TRH called to bring in for observation for syncope.  Review of Systems: Denies fever, chills, weight loss, changes in vision or hearing, headache, cough, sore throat, abdominal pain,  nausea, vomiting, changes in bowel habits, blood in stool, change in bladder habits, myalgias, arthralgias, and rash. Otherwise all others reviewed and are negative.   Past Medical History:  Diagnosis Date  . Arthritis   . Chronic back pain   . DVT (deep venous thrombosis) ([facility])     Past Surgical History:  Procedure Laterality Date  . APPENDECTOMY     - Never smoker, no EtOH or illicit drugs.   No Known Allergies  History reviewed. No pertinent family history. - Family history otherwise reviewed and not pertinent.  Prior to Admission medications   Medication Sig Start Date End Date Taking? Authorizing Provider  cetirizine (ZYRTEC) 10 MG tablet Take 10 mg by mouth daily.   Yes Historical Provider, MD  hydrOXYzine (ATARAX/VISTARIL) 25 MG tablet Take 25 mg by mouth 2 (two) times daily as needed for itching.  12/01/16  Yes Historical Provider, MD  mupirocin ointment (BACTROBAN) 2 % Apply to open area on toe bid Patient not taking: Reported on 08/10/2016 03/16/16   Faythe GheeSusan W Fisher, PA-C    Physical Exam: Vitals:   12/03/16 0849 12/03/16 1103  BP: 147/92 128/75  Pulse: 70 (!) 58  Resp: 16 14  Temp: 98.5 F (36.9 C)   TempSrc: Oral   SpO2: 100% 98%   Constitutional: 66 y.o. male in no distress, calm demeanor Eyes: Lids and conjunctivae normal, PERRL ENMT: Mucous membranes are moist. Posterior pharynx clear of any exudate or lesions. Fair dentition.  Neck: normal, supple, no masses, no thyromegaly Respiratory:  Non-labored breathing without accessory muscle use. Clear breath sounds to auscultation bilaterally Cardiovascular: Regular rate and rhythm, no murmurs, rubs, or gallops. No carotid bruits. No JVD. 2+ pedal pulses. Mild LLE calf swelling compared to right side, non pitting and nontender. Homan's negative.  Abdomen: Normoactive bowel sounds. No tenderness, non-distended, and no masses palpated. No hepatosplenomegaly. GU: No indwelling catheter Musculoskeletal: No clubbing /  cyanosis. No joint deformity upper and lower extremities. Good ROM, no contractures. Normal muscle tone.  Skin: Warm, dry. No rashes, wounds, no ulcers. No significant lesions noted.  Neurologic: CN II-XII grossly intact. Gait normal. Speech normal. No focal deficits in motor strength or sensation in all extremities.  Psychiatric: Alert and oriented x3. Normal judgment and insight. Mood euthymic with congruent affect.   Labs on Admission: I have personally reviewed following labs and imaging studies  CBC:  Recent Labs Lab 12/03/16 0901  WBC 5.9  HGB 14.3  HCT 41.6  MCV 87.2  PLT 226   Basic Metabolic Panel:  Recent Labs Lab 12/03/16 0901  NA 137  K 4.1  CL 107  CO2 22  GLUCOSE 88  BUN 20  CREATININE 1.24  CALCIUM 9.3   GFR: CrCl cannot be calculated (Unknown ideal weight.). Liver Function Tests: No results for input(s): AST, ALT, ALKPHOS, BILITOT, PROT, ALBUMIN in the last 168 hours. No results for input(s): LIPASE, AMYLASE in the last 168 hours. No results for input(s): AMMONIA in the last 168 hours. Coagulation Profile: No results for input(s): INR, PROTIME in the last 168 hours. Cardiac Enzymes: No results for input(s): CKTOTAL, CKMB, CKMBINDEX, TROPONINI in the last 168 hours. BNP (last 3 results) No results for input(s): PROBNP in the last 8760 hours. HbA1C: No results for input(s): HGBA1C in the last 72 hours. CBG:  Recent Labs Lab 12/03/16 0912 12/03/16 0934  GLUCAP 82 106*   Lipid Profile: No results for input(s): CHOL, HDL, LDLCALC, TRIG, CHOLHDL, LDLDIRECT in the last 72 hours. Thyroid Function Tests: No results for input(s): TSH, T4TOTAL, FREET4, T3FREE, THYROIDAB in the last 72 hours. Anemia Panel: No results for input(s): VITAMINB12, FOLATE, FERRITIN, TIBC, IRON, RETICCTPCT in the last 72 hours. Urine analysis:    Component Value Date/Time   COLORURINE YELLOW 12/03/2016 0908   APPEARANCEUR CLEAR 12/03/2016 0908   LABSPEC 1.004 (L)  12/03/2016 0908   PHURINE 6.0 12/03/2016 0908   GLUCOSEU NEGATIVE 12/03/2016 0908   HGBUR NEGATIVE 12/03/2016 0908   BILIRUBINUR NEGATIVE 12/03/2016 0908   KETONESUR NEGATIVE 12/03/2016 0908   PROTEINUR NEGATIVE 12/03/2016 0908   UROBILINOGEN 1.0 08/07/2008 1252   NITRITE NEGATIVE 12/03/2016 0908   LEUKOCYTESUR NEGATIVE 12/03/2016 0908   Sepsis Labs: @LABRCNTIP (procalcitonin:4,lacticidven:4) )No results found for this or any previous visit (from the past 240 hour(s)).   Radiological Exams on Admission: Ct Angio Chest Pe W And/or Wo Contrast  Result Date: 12/03/2016 CLINICAL DATA:  Syncope. EXAM: CT ANGIOGRAPHY CHEST WITH CONTRAST TECHNIQUE: Multidetector CT imaging of the chest was performed using the standard protocol during bolus administration of intravenous contrast. Multiplanar CT image reconstructions and MIPs were obtained to evaluate the vascular anatomy. CONTRAST:  100 cc Isovue 370 IV COMPARISON:  06/12/2004 FINDINGS: Cardiovascular: Heart is normal size. Aorta is normal caliber. No filling defects in the pulmonary arteries to suggest pulmonary emboli. Mediastinum/Nodes: No mediastinal, hilar, or axillary adenopathy. Lungs/Pleura: Lungs are clear. No focal airspace opacities or suspicious nodules. No effusions. Upper Abdomen: Imaging into the upper abdomen shows no acute findings. Musculoskeletal: Chest wall soft tissues are  unremarkable. No acute bony abnormality or focal bone lesion. Review of the MIP images confirms the above findings. IMPRESSION: No evidence of pulmonary embolus.  No acute cardiopulmonary disease. Electronically Signed   By: Charlett Nose M.D.   On: 12/03/2016 10:42    EKG: Independently reviewed. NSR without evidence of heart block or long QT.  Assessment/Plan Active Problems:   Syncope   Syncope: With h/o DVT and left chest pain, LE dopplers and CTA chest ordered, negative for DVT and intrathoracic cause of chest pain including PE. No h/o valvular disorder  or seizures to eplain transient loss of consciousness. Orthostatics negative. - Very mild sinus brady intermittently in ED. Continue telemetry monitoring - Echocardiogram ordered - Consider ambulatory monitoring, cardiology follow up.  Allergies: Chronic, stable - Continue home medication  DVT prophylaxis: Lovenox  Code Status: Full  Family Communication: Wife at bedside Disposition Plan: Anticipate back to home environment pending further syncope work up. Consults called: None  Admission status: Observation    Hazeline Junker, MD [facility] Pager [phone]  If 7PM-7AM, please contact night-coverage [email or web address] Password [facility] 12/03/2016, 11:42 AM

## 2016-12-03 NOTE — ED Triage Notes (Addendum)
Pt c/o syncopal episode today 0715. No hx same. No recent illness. No major heart history. No CP, SOB, weakness. Pt reports left calf pain and heat x 1 week in same location he had DVT previously.

## 2016-12-03 NOTE — ED Notes (Signed)
Patient given urinal and encouraged to void when able. 

## 2016-12-04 ENCOUNTER — Observation Stay (HOSPITAL_BASED_OUTPATIENT_CLINIC_OR_DEPARTMENT_OTHER): Payer: BLUE CROSS/BLUE SHIELD

## 2016-12-04 DIAGNOSIS — M549 Dorsalgia, unspecified: Secondary | ICD-10-CM | POA: Diagnosis not present

## 2016-12-04 DIAGNOSIS — R55 Syncope and collapse: Secondary | ICD-10-CM | POA: Diagnosis not present

## 2016-12-04 DIAGNOSIS — Z86718 Personal history of other venous thrombosis and embolism: Secondary | ICD-10-CM | POA: Diagnosis not present

## 2016-12-04 DIAGNOSIS — G8929 Other chronic pain: Secondary | ICD-10-CM | POA: Diagnosis not present

## 2016-12-04 LAB — ECHOCARDIOGRAM COMPLETE
HEIGHTINCHES: 64 in
WEIGHTICAEL: 3072 [oz_av]

## 2016-12-04 LAB — GLUCOSE, CAPILLARY: GLUCOSE-CAPILLARY: 97 mg/dL (ref 65–99)

## 2016-12-04 NOTE — Progress Notes (Signed)
  Echocardiogram 2D Echocardiogram has been performed.  Leta JunglingCooper, Jeferson Boozer M 12/04/2016, 2:28 PM

## 2016-12-04 NOTE — Discharge Summary (Addendum)
Physician Discharge Summary  Brent Norman JXB:[id number] DOB: 05-21-1951 DOA: 12/03/2016  PCP: No PCP Per Patient  Admit date: 12/03/2016 Discharge date: 12/04/2016  Admitted From: home   Disposition:  homd   Recommendations for Outpatient Follow-up:  1. F/u on event monitor  Home Health:  none  Equipment/Devices:  none    Discharge Condition:  stable   CODE STATUS:  Full code   Diet recommendation:  Heart healthy Consultations:      Discharge Diagnoses:  Active Problems:   Syncope    Subjective: No complaints today.   Brief Summary: Brent Norman is a 67 y.o. 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 The night prior to presentation he reports he was sitting on a window sill in a take out restaurant waiting for his dinner order when the next thing he knew he was laying on the floor. There were no witnesses. He denies any preceding symptoms and doesn't know how long he was down for. He got up and walked outside for fresh air and felt flushed and light headed, nearly losing consciousness again but caught himself on a trash can. He believes he scraped his head during this near fall, but felt well enough to get dinner, drive home, and didn't believe these symptoms were concerning until his wife made him come into the ED this morning. He denies any preceding symptoms, but when pressed he says the top of his head was warm and when he came to on the ground his head felt sweaty. He was never disoriented or weak feeling. He was not having chest pain, dyspnea, palpitations, or vertigo. He reports chronic left leg swelling and endorsed mild intermittent pleuritic chest pain not changed with exertion that he has had for several weeks. Had a DVT treated with anticoagulation (can't recall the drug) for 6 months about 3 years ago.    Hospital Course:  Syncope - sudden loss of consciousness associated with a sensation of feeling flushed -  etiology undetermined. Did not appear dehydrated or orthostatic -  ECHO noted below is normal.  - CT head normal, CTA chest and venous duplex of LE normal - no arrhythmia's noted on tele and admission EKG normal - I have spoken with [facility] to make arrangements for for an event monitor- [facility] will contact him for this  Discharge Instructions  Discharge Instructions    Diet - low sodium heart healthy    Complete by:  As directed    Increase activity slowly    Complete by:  As directed      Allergies as of 12/04/2016   No Known Allergies     Medication List    TAKE these medications   cetirizine 10 MG tablet Commonly known as:  ZYRTEC Take 10 mg by mouth daily.   hydrOXYzine 25 MG tablet Commonly known as:  ATARAX/VISTARIL Take 25 mg by mouth 2 (two) times daily as needed for itching.       No Known Allergies   Procedures/Studies: 2 D ECHO Normal LV size with EF 55%. Normal RV size and systolic function.   No significant valvular abnormalities.  LE venous ultrasound Left lower extremity venous duplex has been completed.  Preliminary findings: No evidence of DVT or baker's cyst.  Ct Angio Chest Pe W And/or Wo Contrast  Result Date: 12/03/2016 CLINICAL DATA:  Syncope. EXAM: CT ANGIOGRAPHY CHEST WITH CONTRAST TECHNIQUE: Multidetector CT imaging of the chest was performed using the  standard protocol during bolus administration of intravenous contrast. Multiplanar CT image reconstructions and MIPs were obtained to evaluate the vascular anatomy. CONTRAST:  100 cc Isovue 370 IV COMPARISON:  06/12/2004 FINDINGS: Cardiovascular: Heart is normal size. Aorta is normal caliber. No filling defects in the pulmonary arteries to suggest pulmonary emboli. Mediastinum/Nodes: No mediastinal, hilar, or axillary adenopathy. Lungs/Pleura: Lungs are clear. No focal airspace opacities or suspicious nodules. No effusions. Upper Abdomen: Imaging into the upper abdomen shows no acute findings.  Musculoskeletal: Chest wall soft tissues are unremarkable. No acute bony abnormality or focal bone lesion. Review of the MIP images confirms the above findings. IMPRESSION: No evidence of pulmonary embolus.  No acute cardiopulmonary disease. Electronically Signed   By: Charlett Nose M.D.   On: 12/03/2016 10:42       Discharge Exam: Vitals:   12/04/16 0521 12/04/16 1450  BP: 137/69 (!) 142/70  Pulse: (!) 56 (!) 57  Resp: 18 18  Temp: 97.8 F (36.6 C) 98.6 F (37 C)   Vitals:   12/03/16 1818 12/03/16 2121 12/04/16 0521 12/04/16 1450  BP: 128/68 111/64 137/69 (!) 142/70  Pulse: 62 (!) 56 (!) 56 (!) 57  Resp: 18 18 18 18   Temp: 98.1 F (36.7 C) 98 F (36.7 C) 97.8 F (36.6 C) 98.6 F (37 C)  TempSrc: Oral Oral Oral Oral  SpO2: 95% 98% 97% 97%  Weight:      Height:        General: Pt is alert, awake, not in acute distress Cardiovascular: RRR, S1/S2 +, no rubs, no gallops Respiratory: CTA bilaterally, no wheezing, no rhonchi Abdominal: Soft, NT, ND, bowel sounds + Extremities: no edema, no cyanosis    The results of significant diagnostics from this hospitalization (including imaging, microbiology, ancillary and laboratory) are listed below for reference.     Microbiology: No results found for this or any previous visit (from the past 240 hour(s)).   Labs: BNP (last 3 results) No results for input(s): BNP in the last 8760 hours. Basic Metabolic Panel:  Recent Labs Lab 12/03/16 0901  NA 137  K 4.1  CL 107  CO2 22  GLUCOSE 88  BUN 20  CREATININE 1.24  CALCIUM 9.3   Liver Function Tests: No results for input(s): AST, ALT, ALKPHOS, BILITOT, PROT, ALBUMIN in the last 168 hours. No results for input(s): LIPASE, AMYLASE in the last 168 hours. No results for input(s): AMMONIA in the last 168 hours. CBC:  Recent Labs Lab 12/03/16 0901  WBC 5.9  HGB 14.3  HCT 41.6  MCV 87.2  PLT 226   Cardiac Enzymes: No results for input(s): CKTOTAL, CKMB, CKMBINDEX,  TROPONINI in the last 168 hours. BNP: Invalid input(s): POCBNP CBG:  Recent Labs Lab 12/03/16 0912 12/03/16 0934 12/04/16 0738  GLUCAP 82 106* 97   D-Dimer No results for input(s): DDIMER in the last 72 hours. Hgb A1c No results for input(s): HGBA1C in the last 72 hours. Lipid Profile No results for input(s): CHOL, HDL, LDLCALC, TRIG, CHOLHDL, LDLDIRECT in the last 72 hours. Thyroid function studies No results for input(s): TSH, T4TOTAL, T3FREE, THYROIDAB in the last 72 hours.  Invalid input(s): FREET3 Anemia work up No results for input(s): VITAMINB12, FOLATE, FERRITIN, TIBC, IRON, RETICCTPCT in the last 72 hours. Urinalysis    Component Value Date/Time   COLORURINE YELLOW 12/03/2016 0908   APPEARANCEUR CLEAR 12/03/2016 0908   LABSPEC 1.004 (L) 12/03/2016 0908   PHURINE 6.0 12/03/2016 0908   GLUCOSEU NEGATIVE 12/03/2016 0908  HGBUR NEGATIVE 12/03/2016 0908   BILIRUBINUR NEGATIVE 12/03/2016 0908   KETONESUR NEGATIVE 12/03/2016 0908   PROTEINUR NEGATIVE 12/03/2016 0908   UROBILINOGEN 1.0 08/07/2008 1252   NITRITE NEGATIVE 12/03/2016 0908   LEUKOCYTESUR NEGATIVE 12/03/2016 0908   Sepsis Labs Invalid input(s): PROCALCITONIN,  WBC,  LACTICIDVEN Microbiology No results found for this or any previous visit (from the past 240 hour(s)).   Time coordinating discharge: Over 30 minutes  SIGNED:   Calvert CantorIZWAN,Trystyn Sitts, MD  [facility] 12/04/2016, 5:08 PM Pager   If 7PM-7AM, please contact night-coverage [email or web address] Password [email or web address]

## 2016-12-04 NOTE — Plan of Care (Addendum)
Problem: Pain Managment: Goal: General experience of comfort will improve Outcome: Progressing Denies pain.    Problem: Bowel/Gastric: Goal: Will not experience complications related to bowel motility Outcome: Progressing

## 2016-12-04 NOTE — Care Management Note (Signed)
Case Management Note  Patient Details  Name: Brent MerleDavid M Norman MRN: [id number] Date of Birth: 02-05-1951  Subjective/Objective:   66 y/o m admitted w/syncope. From home. Provided w/pcp listing-encourgaged [employer] cust service pcp directory-voiced understanding. Also provided w/pcp listing as resource. Provided w/$4 [facility] med list, explained that they have health insurance w/ a co pay obligation-voiced understanding.No further CM needs.                 Action/Plan:d/c home.   Expected Discharge Date:                  Expected Discharge Plan:  Home/Self Care  In-House Referral:  PCP / [facility]  Discharge planning Services  CM Consult  Post Acute Care Choice:    Choice offered to:     DME Arranged:    DME Agency:     HH Arranged:    HH Agency:     Status of Service:  Completed, signed off  If discussed at [employer] Length of Stay Meetings, dates discussed:    Additional Comments:  Lanier ClamMahabir, Ustin Cruickshank, RN 12/04/2016, 12:58 PM

## 2016-12-05 ENCOUNTER — Telehealth: Payer: Self-pay

## 2016-12-05 NOTE — Progress Notes (Signed)
[facility] event monitor.  RN call Elwin SleightBetty Denton, patient spouse back and explained that she would be notified within 3-4 days about insurance approval.  Kathie RhodesBetty stated understanding.   Brent Norman A

## 2016-12-05 NOTE — Telephone Encounter (Signed)
Patient is calling and states that he was discharged yesterday from the hospital and is suppose to come to our office and have an event monitor placed. There are no current orders for this and this is not an established patient. There is no PCP listed and in the discharge instructions Calvert CantorSaima Rizwan, MD did not state who [facility] and who the patient should follow up with. Patient advised that a message would be sent to Calvert CantorSaima Rizwan, MD to coordinate with our office on placing the order and who the patient should follow up with.

## 2016-12-07 DIAGNOSIS — J302 Other seasonal allergic rhinitis: Secondary | ICD-10-CM | POA: Diagnosis not present

## 2016-12-07 DIAGNOSIS — M179 Osteoarthritis of knee, unspecified: Secondary | ICD-10-CM | POA: Diagnosis not present

## 2016-12-07 DIAGNOSIS — R55 Syncope and collapse: Secondary | ICD-10-CM | POA: Diagnosis not present

## 2016-12-07 DIAGNOSIS — E784 Other hyperlipidemia: Secondary | ICD-10-CM | POA: Diagnosis not present

## 2016-12-08 ENCOUNTER — Telehealth: Payer: Self-pay | Admitting: *Deleted

## 2016-12-08 NOTE — Telephone Encounter (Signed)
[facility]--have we received this yet?

## 2016-12-11 NOTE — Telephone Encounter (Signed)
No maam I have not got a order or referral on this pt.  As soon as I do I will call him.   Lamar LaundrySonya

## 2016-12-27 DIAGNOSIS — R55 Syncope and collapse: Secondary | ICD-10-CM | POA: Diagnosis not present

## 2016-12-27 DIAGNOSIS — J302 Other seasonal allergic rhinitis: Secondary | ICD-10-CM | POA: Diagnosis not present

## 2016-12-27 DIAGNOSIS — M179 Osteoarthritis of knee, unspecified: Secondary | ICD-10-CM | POA: Diagnosis not present

## 2016-12-27 DIAGNOSIS — E784 Other hyperlipidemia: Secondary | ICD-10-CM | POA: Diagnosis not present

## 2017-01-01 ENCOUNTER — Encounter: Payer: Self-pay | Admitting: *Deleted

## 2017-01-01 NOTE — Progress Notes (Signed)
Patient ID: Brent MerleDavid M Norman, male   DOB: 06-17-1951, 66 y.o.   MRN: [id number] Patient did not show up for 01/01/17, 3:30 PM, appointment, to have a cardiac event monitor applied.

## 2017-01-04 DIAGNOSIS — R55 Syncope and collapse: Secondary | ICD-10-CM | POA: Diagnosis not present

## 2017-01-04 DIAGNOSIS — E784 Other hyperlipidemia: Secondary | ICD-10-CM | POA: Diagnosis not present

## 2017-01-04 DIAGNOSIS — J302 Other seasonal allergic rhinitis: Secondary | ICD-10-CM | POA: Diagnosis not present

## 2017-01-04 DIAGNOSIS — M179 Osteoarthritis of knee, unspecified: Secondary | ICD-10-CM | POA: Diagnosis not present

## 2017-01-29 ENCOUNTER — Ambulatory Visit (INDEPENDENT_AMBULATORY_CARE_PROVIDER_SITE_OTHER): Payer: BLUE CROSS/BLUE SHIELD

## 2017-01-29 DIAGNOSIS — R55 Syncope and collapse: Secondary | ICD-10-CM | POA: Diagnosis not present

## 2017-01-31 DIAGNOSIS — I471 Supraventricular tachycardia: Secondary | ICD-10-CM | POA: Diagnosis not present

## 2017-05-18 DIAGNOSIS — E784 Other hyperlipidemia: Secondary | ICD-10-CM | POA: Diagnosis not present

## 2017-05-18 DIAGNOSIS — M179 Osteoarthritis of knee, unspecified: Secondary | ICD-10-CM | POA: Diagnosis not present

## 2017-05-18 DIAGNOSIS — J302 Other seasonal allergic rhinitis: Secondary | ICD-10-CM | POA: Diagnosis not present

## 2017-05-18 DIAGNOSIS — M189 Osteoarthritis of first carpometacarpal joint, unspecified: Secondary | ICD-10-CM | POA: Diagnosis not present

## 2018-01-09 ENCOUNTER — Emergency Department (HOSPITAL_COMMUNITY): Payer: BLUE CROSS/BLUE SHIELD

## 2018-01-09 ENCOUNTER — Other Ambulatory Visit: Payer: Self-pay

## 2018-01-09 ENCOUNTER — Emergency Department (HOSPITAL_COMMUNITY)
Admission: EM | Admit: 2018-01-09 | Discharge: 2018-01-09 | Disposition: A | Discharge status: Home / Self Care | Payer: BLUE CROSS/BLUE SHIELD | Attending: Emergency Medicine | Admitting: Emergency Medicine

## 2018-01-09 ENCOUNTER — Encounter (HOSPITAL_COMMUNITY): Payer: Self-pay | Admitting: *Deleted

## 2018-01-09 DIAGNOSIS — R05 Cough: Secondary | ICD-10-CM | POA: Diagnosis not present

## 2018-01-09 DIAGNOSIS — J069 Acute upper respiratory infection, unspecified: Secondary | ICD-10-CM | POA: Diagnosis not present

## 2018-01-09 DIAGNOSIS — Z87891 Personal history of nicotine dependence: Secondary | ICD-10-CM | POA: Diagnosis not present

## 2018-01-09 DIAGNOSIS — Z86718 Personal history of other venous thrombosis and embolism: Secondary | ICD-10-CM | POA: Insufficient documentation

## 2018-01-09 DIAGNOSIS — R69 Illness, unspecified: Secondary | ICD-10-CM

## 2018-01-09 DIAGNOSIS — R6883 Chills (without fever): Secondary | ICD-10-CM | POA: Insufficient documentation

## 2018-01-09 DIAGNOSIS — M7918 Myalgia, other site: Secondary | ICD-10-CM | POA: Insufficient documentation

## 2018-01-09 DIAGNOSIS — J111 Influenza due to unidentified influenza virus with other respiratory manifestations: Secondary | ICD-10-CM | POA: Diagnosis not present

## 2018-01-09 MED ORDER — CETIRIZINE HCL 10 MG PO TABS: 10.0000 mg | ORAL_TABLET | Freq: Every day | ORAL | 0 refills | Status: AC

## 2018-01-09 MED ORDER — ACETAMINOPHEN ER 650 MG PO TBCR: 650.0000 mg | EXTENDED_RELEASE_TABLET | Freq: Three times a day (TID) | ORAL | 0 refills | Status: DC | PRN

## 2018-01-09 NOTE — Discharge Instructions (Signed)
We think what you have is a viral syndrome - the treatment for which is symptomatic relief only, and your body will fight the infection off in a few days. We are prescribing you some meds for pain and fevers. See your primary care doctor in 1 week if the symptoms dont improve.  Please return to the ER if your symptoms worsen; you have increased pain, fevers, chills, inability to keep any medications down, confusion.

## 2018-01-09 NOTE — ED Triage Notes (Signed)
Pt developed cold/URI symptoms, continues to feel bad and has a cough, small amount white sputum.

## 2018-01-10 NOTE — ED Provider Notes (Signed)
[facility]-EMERGENCY DEPT Provider Note   CSN: [id number] Arrival date & time: 01/09/18  1209     History   Chief Complaint Chief Complaint  Patient presents with  . URI    HPI Truitt MerleDavid M Radle is a 67 y.o. male.  HPI  67 year old with history of DVT, arthritis comes in a chief complaint of URI-like symptoms.  Patient states that he started getting sick a few days ago.  Patient started with a cough, body aches and weakness.  Over time patient is having chills as well.  Cough is producing clear phlegm.  Patient is having subjective fevers.  Patient has been exposed to other people with similar illnesses.  Patient did get a flu shot this year.  No history of CHF, COPD or immunosuppression.  Past Medical History:  Diagnosis Date  . Arthritis   . Chronic back pain   . DVT (deep venous thrombosis) [facility](HCC)     Patient Active Problem List   Diagnosis Date Noted  . Syncope 12/03/2016    Past Surgical History:  Procedure Laterality Date  . APPENDECTOMY         Home Medications    Prior to Admission medications   Medication Sig Start Date End Date Taking? Authorizing Provider  acetaminophen (TYLENOL 8 HOUR) 650 MG CR tablet Take 1 tablet (650 mg total) by mouth every 8 (eight) hours as needed. 01/09/18   Derwood KaplanNanavati, Emilia Kayes, MD  cetirizine (ZYRTEC) 10 MG tablet Take 1 tablet (10 mg total) by mouth daily. 01/09/18   Derwood KaplanNanavati, Artemus Romanoff, MD  hydrOXYzine (ATARAX/VISTARIL) 25 MG tablet Take 25 mg by mouth 2 (two) times daily as needed for itching.  12/01/16   [provider]  mupirocin ointment (BACTROBAN) 2 % Apply to open area on toe bid Patient not taking: Reported on 08/10/2016 03/16/16   Faythe GheeFisher, Susan W, PA-C    Family History No family history on file.  Social History Social History   Tobacco Use  . Smoking status: Former Games developermoker  . Smokeless tobacco: Never Used  Substance Use Topics  . Alcohol use: No  . Drug use: No     Allergies   Patient  has no known allergies.   Review of Systems Review of Systems  Constitutional: Positive for activity change.  Respiratory: Positive for cough. Negative for shortness of breath.   Cardiovascular: Negative for chest pain.  Hematological: Does not bruise/bleed easily.  All other systems reviewed and are negative.    Physical Exam Updated Vital Signs BP 136/83 (BP Location: Right Arm)   Pulse 77   Temp 97.8 F (36.6 C)   Resp 18   Ht 5\' 4"  (1.626 m)   Wt 90.7 kg (200 lb)   SpO2 98%   BMI 34.33 kg/m   Physical Exam  Constitutional: He is oriented to person, place, and time. He appears well-developed.  HENT:  Head: Atraumatic.  Neck: Neck supple.  Cardiovascular: Normal rate.  Pulmonary/Chest: Effort normal.  Musculoskeletal: He exhibits no edema.  Neurological: He is alert and oriented to person, place, and time.  Skin: Skin is warm.  Nursing note and vitals reviewed.    ED Treatments / Results  Labs (all labs ordered are listed, but only abnormal results are displayed) Labs Reviewed - No data to display  EKG  EKG Interpretation None       Radiology Dg Chest 2 View  Result Date: 01/09/2018 CLINICAL DATA:  productive cough x 3 days; no known cardiopulmonary problems; ex smoker  EXAM: CHEST - 2 VIEW COMPARISON:  None. FINDINGS: Normal mediastinum and cardiac silhouette. Normal pulmonary vasculature. No evidence of effusion, infiltrate, or pneumothorax. No acute bony abnormality. Degenerative osteophytosis of the spine. IMPRESSION: No acute cardiopulmonary process. Electronically Signed   By: Genevive Bi M.D.   On: 01/09/2018 13:10    Procedures Procedures (including critical care time)  Medications Ordered in ED Medications - No data to display   Initial Impression / Assessment and Plan / ED Course  I have reviewed the triage vital signs and the nursing notes.  Pertinent labs & imaging results that were available during my care of the patient were  reviewed by me and considered in my medical decision making (see chart for details).     DDX includes: Viral syndrome Influenza Pharyngitis Sinusitis  67 year old comes in with complaint that are consistent with influenza-like illness or viral syndrome.  Does not have any underlying cardiac or lung disease.  He is immunocompetent.  Symptoms started about 5 days ago.  Patient does not appear toxic.  We will discharge him with symptom management only.  Strict ER return precautions have been discussed and patient is in agreement with the plan.    Final Clinical Impressions(s) / ED Diagnoses   Final diagnoses:  Influenza-like illness  Acute upper respiratory infection    ED Discharge Orders        Ordered    acetaminophen (TYLENOL 8 HOUR) 650 MG CR tablet  Every 8 hours PRN     01/09/18 1534    cetirizine (ZYRTEC) 10 MG tablet  Daily     01/09/18 1534       Derwood Kaplan, MD 01/10/18 1515

## 2018-05-31 ENCOUNTER — Encounter: Payer: Self-pay | Admitting: Emergency Medicine

## 2018-05-31 ENCOUNTER — Other Ambulatory Visit: Payer: Self-pay

## 2018-05-31 ENCOUNTER — Ambulatory Visit: Payer: Self-pay | Admitting: Emergency Medicine

## 2018-05-31 ENCOUNTER — Emergency Department
Admission: EM | Admit: 2018-05-31 | Discharge: 2018-05-31 | Disposition: A | Discharge status: Home / Self Care | Payer: BLUE CROSS/BLUE SHIELD | Attending: Emergency Medicine | Admitting: Emergency Medicine

## 2018-05-31 ENCOUNTER — Emergency Department: Payer: BLUE CROSS/BLUE SHIELD

## 2018-05-31 VITALS — BP 152/76 | HR 57 | Resp 17 | Wt 196.0 lb

## 2018-05-31 DIAGNOSIS — Z79899 Other long term (current) drug therapy: Secondary | ICD-10-CM | POA: Insufficient documentation

## 2018-05-31 DIAGNOSIS — I10 Essential (primary) hypertension: Secondary | ICD-10-CM | POA: Diagnosis not present

## 2018-05-31 DIAGNOSIS — H532 Diplopia: Secondary | ICD-10-CM

## 2018-05-31 DIAGNOSIS — Z87891 Personal history of nicotine dependence: Secondary | ICD-10-CM | POA: Insufficient documentation

## 2018-05-31 DIAGNOSIS — H539 Unspecified visual disturbance: Secondary | ICD-10-CM | POA: Diagnosis not present

## 2018-05-31 LAB — BASIC METABOLIC PANEL
Anion gap: 6 (ref 5–15)
BUN: 11 mg/dL (ref 8–23)
CALCIUM: 8.7 mg/dL — AB (ref 8.9–10.3)
CO2: 25 mmol/L (ref 22–32)
Chloride: 106 mmol/L (ref 98–111)
Creatinine, Ser: 0.97 mg/dL (ref 0.61–1.24)
GFR calc Af Amer: >60 mL/min (ref >60)
GFR calc non Af Amer: >60 mL/min (ref >60)
GLUCOSE: 95 mg/dL (ref 70–99)
Potassium: 3.9 mmol/L (ref 3.5–5.1)
SODIUM: 137 mmol/L (ref 135–145)

## 2018-05-31 LAB — CBC
HCT: 39.4 % — ABNORMAL LOW (ref 40.0–52.0)
Hemoglobin: 13.7 g/dL (ref 13.0–18.0)
MCH: 31.7 pg (ref 26.0–34.0)
MCHC: 34.8 g/dL (ref 32.0–36.0)
MCV: 91 fL (ref 80.0–100.0)
PLATELETS: 207 10*3/uL (ref 150–440)
RBC: 4.33 MIL/uL — AB (ref 4.40–5.90)
RDW: 13.3 % (ref 11.5–14.5)
WBC: 5.5 10*3/uL (ref 3.8–10.6)

## 2018-05-31 LAB — GLUCOSE, POCT (MANUAL RESULT ENTRY): POC Glucose: 83 mg/dl (ref 70–99)

## 2018-05-31 MED ORDER — HYDROCHLOROTHIAZIDE 12.5 MG PO TABS: 12.5000 mg | ORAL_TABLET | Freq: Every day | ORAL | 2 refills | Status: DC

## 2018-05-31 NOTE — Progress Notes (Signed)
Performed Snellen Test:  UNCORRECTED: B/L- 20/40 R- 20/40 L- 20/50  No Corrective Lenses

## 2018-05-31 NOTE — Progress Notes (Signed)
Subjective. 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  His episode was of horizontal nystagmus and it lasted about 5 to 10 seconds.  He had a minimal headache associated with this.  He did not have any difficulty with speech, thinking, or weakness of his arms or legs.  He had some minimal vague chest discomfort this morning.  After his episode he went to the fire department and was told his blood pressure was elevated.  He has no history of having problems with this.  He enters today for evaluation. Social history. He does not smoke, drink, or use drugs. Medications. None. Objective. He is alert cooperative and oriented. His neck is supple. Chest clear to auscultation and percussion. Heart regular rate and rhythm. Abdomen soft nontender. Extremities.  No edema. Neurological. Oriented to time place and person. Cranial nerves II through XII are intact. 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 No carotid bruits. Motor strength 5 out of 5. Gait evaluation reveals intoeing bilaterally. Assessment. Patient had an episode of horizontal nystagmus yesterday.  Unclear whether this was a neurological event, cardiac arrhythmia, or whether he could have ocular myasthenia.  This was his first episode.  His neurological exam is nonfocal.  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  He was admitted for syncope last year.. Plan. Blood sugar checked was normal.  Visual acuity was 20/40 on the right 20/50 on the left.  He has bilateral cataracts. disc margins could not be appreciated.  I advised him to go to the emergency room for basic blood work evaluation and consideration for neuro imaging and decision regarding further cardiac evaluation.Marland Kitchen.   His blood pressure was borderline elevated but when I rechecked it was   122/82.  The nurse stated that  when she first brought him back to the room his heart rate was elevated at 119 but when she got him to the room his heart rate had returned to 57.The Patient was admitted for syncope 11/26/2016.Patient could be having arrythmias. At that time he had a CT head, echocardiogram, CT angiogram of the lungs. An event monitor was ordered that visit.Triage nurse was called and info given to her.He did have his event monitor done.If no cardiac event is found and his scan is negative ocular myasthenia would be in the differential.

## 2018-05-31 NOTE — ED Triage Notes (Signed)
Pt arrived with concerns over double vision that started yesterday. Pt states is only lasted for about 10 seconds yesterday. Pt denies any double vision right now. Pt states he went to the fire dept who checked his blood pressure and noticed a high reading. Fire dept had pt return today for a recheck and after finding another high reading they sent pt to be evaluated further. Pt denies any pain

## 2018-05-31 NOTE — Addendum Note (Signed)
Addended by: Dollene PrimroseBURKE, Henry Utsey on: 05/31/2018 10:51 AM   Modules accepted: Orders

## 2018-05-31 NOTE — ED Provider Notes (Signed)
[facility] Emergency Department Provider Note  Time seen: 11:50 AM  I have reviewed the triage vital signs and the nursing notes.   HISTORY  Chief Complaint Hypertension    HPI Brent Norman is a 67 y.o. male with a past medical history of chronic back pain, arthritis, presents to the emergency department for high blood pressure.  According to the patient yesterday he had a brief episode of double vision.  States the double vision only lasted approximately 10 seconds and then resolved on its own.  No visual disturbance currently.  Patient went to the fire department yesterday and had his blood pressure checked and it was elevated.  They told the patient to return today and they checked it again it remained elevated so the patient went to the walk-in clinic.  At the walk-in clinic the patient was evaluated and ultimately sent to the emergency department for consideration of neurological imaging.  Here the patient appears well, he has no complaints at this time.  Denies any visual deficits.  Denies any weakness or numbness of any arm or leg at any time, denies any confusion slurred speech or difficulty speaking.  Denies any history of hypertension.  Currently 164/90 in the emergency department.   Past Medical History:  Diagnosis Date  . Arthritis   . Chronic back pain   . DVT (deep venous thrombosis) [facility](HCC)     Patient Active Problem List   Diagnosis Date Noted  . Syncope 12/03/2016    Past Surgical History:  Procedure Laterality Date  . APPENDECTOMY      Prior to Admission medications   Medication Sig Start Date End Date Taking? Authorizing Provider  acetaminophen (TYLENOL 8 HOUR) 650 MG CR tablet Take 1 tablet (650 mg total) by mouth every 8 (eight) hours as needed. 01/09/18   Derwood KaplanNanavati, Ankit, MD  cetirizine (ZYRTEC) 10 MG tablet Take 1 tablet (10 mg total) by mouth daily. 01/09/18   Derwood KaplanNanavati, Ankit, MD  hydrOXYzine (ATARAX/VISTARIL) 25 MG tablet Take 25 mg  by mouth 2 (two) times daily as needed for itching.  12/01/16   [provider]  mupirocin ointment (BACTROBAN) 2 % Apply to open area on toe bid Patient not taking: Reported on 08/10/2016 03/16/16   Faythe GheeFisher, Susan W, PA-C    No Known Allergies  No family history on file.  Social History Social History   Tobacco Use  . Smoking status: Former Games developermoker  . Smokeless tobacco: Never Used  Substance Use Topics  . Alcohol use: No  . Drug use: No    Review of Systems Constitutional: Negative for fever. Eyes: double vision, now resolved ENT: Negative for recent illness/congestion Cardiovascular: Negative for chest pain. Respiratory: Negative for shortness of breath. Gastrointestinal: Negative for abdominal pain, vomiting Genitourinary: Negative for urinary compaints Musculoskeletal: Negative for musculoskeletal complaints Skin: Negative for skin complaints  Neurological: Negative for headache All other ROS negative  ____________________________________________   PHYSICAL EXAM:  VITAL SIGNS: ED Triage Vitals [05/31/18 1004]  Enc Vitals Group     BP (!) 164/90     Pulse Rate 61     Resp 18     Temp 98.2 F (36.8 C)     Temp Source Oral     SpO2 99 %     Weight 196 lb (88.9 kg)     Height 5\' 4"  (1.626 m)     Head Circumference      Peak Flow      Pain Score 0  Pain Loc      Pain Edu?      Excl. in GC?    Constitutional: Alert and oriented. Well appearing and in no distress. Eyes: Normal exam ENT   Head: Normocephalic and atraumatic.   Mouth/Throat: Mucous membranes are moist. Cardiovascular: Normal rate, regular rhythm. No murmur Respiratory: Normal respiratory effort without tachypnea nor retractions. Breath sounds are clear  Gastrointestinal: Soft and nontender. No distention.  Musculoskeletal: Nontender with normal range of motion in all extremities. Neurologic:  Normal speech and language. No gross focal neurologic deficits  Skin:  Skin is warm,  dry and intact.  Psychiatric: Mood and affect are normal.   ____________________________________________    EKG  EKG reviewed and interpreted by myself shows sinus bradycardia 54 bpm with a narrow QRS, normal axis, normal intervals, no concerning ST changes.  ____________________________________________    RADIOLOGY  CT scan of the head shows left mastoid effusion otherwise negative.  ____________________________________________   INITIAL IMPRESSION / ASSESSMENT AND PLAN / ED COURSE  Pertinent labs & imaging results that were available during my care of the patient were reviewed by me and considered in my medical decision making (see chart for details).  Patient presented to the emergency department for evaluation of high blood pressure and blurry/double vision yesterday.  Symptoms have since resolved.  Differential wound include ICH, CVA/mass/lesion, hypertension, myasthenia, MS.  States symptoms only lasted approximately 10 seconds before resolving yesterday.  Patient's blood pressure is somewhat elevated 143/89 currently was more elevated upon arrival 160s over 90s.  Believe patient would be a good candidate for low-dose hydrochlorothiazide to start with primary care follow-up for further work-up.  Patient agreeable to plan of care.  ____________________________________________   FINAL CLINICAL IMPRESSION(S) / ED DIAGNOSES  Hypertension Visual disturbance    Minna Antis, MD 05/31/18 1240

## 2018-05-31 NOTE — Discharge Instructions (Addendum)
Please follow-up with your primary care doctor as soon as possible regarding your high blood pressure.  Please begin taking your medication today.  Return to the emergency department for any further visual disturbances any double vision, any weakness, numbness of any arm or leg slurred speech or confusion.

## 2018-06-11 DIAGNOSIS — I10 Essential (primary) hypertension: Secondary | ICD-10-CM | POA: Diagnosis not present

## 2018-06-11 DIAGNOSIS — M179 Osteoarthritis of knee, unspecified: Secondary | ICD-10-CM | POA: Diagnosis not present

## 2018-06-11 DIAGNOSIS — Z125 Encounter for screening for malignant neoplasm of prostate: Secondary | ICD-10-CM | POA: Diagnosis not present

## 2018-06-11 DIAGNOSIS — E7849 Other hyperlipidemia: Secondary | ICD-10-CM | POA: Diagnosis not present

## 2018-06-11 DIAGNOSIS — Z131 Encounter for screening for diabetes mellitus: Secondary | ICD-10-CM | POA: Diagnosis not present

## 2018-07-23 DIAGNOSIS — I10 Essential (primary) hypertension: Secondary | ICD-10-CM | POA: Diagnosis not present

## 2018-07-23 DIAGNOSIS — M179 Osteoarthritis of knee, unspecified: Secondary | ICD-10-CM | POA: Diagnosis not present

## 2018-07-23 DIAGNOSIS — R252 Cramp and spasm: Secondary | ICD-10-CM | POA: Diagnosis not present

## 2018-07-23 DIAGNOSIS — Z23 Encounter for immunization: Secondary | ICD-10-CM | POA: Diagnosis not present

## 2018-08-13 DIAGNOSIS — H25013 Cortical age-related cataract, bilateral: Secondary | ICD-10-CM | POA: Diagnosis not present

## 2018-08-13 DIAGNOSIS — H2513 Age-related nuclear cataract, bilateral: Secondary | ICD-10-CM | POA: Diagnosis not present

## 2018-08-22 DIAGNOSIS — J302 Other seasonal allergic rhinitis: Secondary | ICD-10-CM | POA: Diagnosis not present

## 2018-08-22 DIAGNOSIS — I10 Essential (primary) hypertension: Secondary | ICD-10-CM | POA: Diagnosis not present

## 2018-08-22 DIAGNOSIS — M179 Osteoarthritis of knee, unspecified: Secondary | ICD-10-CM | POA: Diagnosis not present

## 2018-09-03 DIAGNOSIS — E7849 Other hyperlipidemia: Secondary | ICD-10-CM | POA: Diagnosis not present

## 2018-09-03 DIAGNOSIS — M179 Osteoarthritis of knee, unspecified: Secondary | ICD-10-CM | POA: Diagnosis not present

## 2018-09-03 DIAGNOSIS — J302 Other seasonal allergic rhinitis: Secondary | ICD-10-CM | POA: Diagnosis not present

## 2018-09-03 DIAGNOSIS — I10 Essential (primary) hypertension: Secondary | ICD-10-CM | POA: Diagnosis not present

## 2018-09-03 DIAGNOSIS — Z1211 Encounter for screening for malignant neoplasm of colon: Secondary | ICD-10-CM | POA: Diagnosis not present

## 2018-10-15 DIAGNOSIS — M179 Osteoarthritis of knee, unspecified: Secondary | ICD-10-CM | POA: Diagnosis not present

## 2018-10-15 DIAGNOSIS — J302 Other seasonal allergic rhinitis: Secondary | ICD-10-CM | POA: Diagnosis not present

## 2018-10-15 DIAGNOSIS — Z202 Contact with and (suspected) exposure to infections with a predominantly sexual mode of transmission: Secondary | ICD-10-CM | POA: Diagnosis not present

## 2018-10-15 DIAGNOSIS — I10 Essential (primary) hypertension: Secondary | ICD-10-CM | POA: Diagnosis not present

## 2019-09-10 ENCOUNTER — Emergency Department (HOSPITAL_COMMUNITY): Payer: No Typology Code available for payment source

## 2019-09-10 ENCOUNTER — Encounter (HOSPITAL_COMMUNITY): Payer: Self-pay

## 2019-09-10 ENCOUNTER — Emergency Department (HOSPITAL_COMMUNITY): Payer: Self-pay | Attending: Emergency Medicine

## 2019-09-10 ENCOUNTER — Emergency Department (HOSPITAL_COMMUNITY)
Admission: EM | Admit: 2019-09-10 | Discharge: 2019-09-10 | Disposition: A | Discharge status: Home / Self Care | Payer: No Typology Code available for payment source | Attending: Emergency Medicine | Admitting: Emergency Medicine

## 2019-09-10 ENCOUNTER — Other Ambulatory Visit: Payer: Self-pay

## 2019-09-10 DIAGNOSIS — S32009A Unspecified fracture of unspecified lumbar vertebra, initial encounter for closed fracture: Secondary | ICD-10-CM | POA: Diagnosis not present

## 2019-09-10 DIAGNOSIS — Y999 Unspecified external cause status: Secondary | ICD-10-CM | POA: Diagnosis not present

## 2019-09-10 DIAGNOSIS — I10 Essential (primary) hypertension: Secondary | ICD-10-CM | POA: Diagnosis not present

## 2019-09-10 DIAGNOSIS — Y92410 Unspecified street and highway as the place of occurrence of the external cause: Secondary | ICD-10-CM | POA: Insufficient documentation

## 2019-09-10 DIAGNOSIS — M5489 Other dorsalgia: Secondary | ICD-10-CM | POA: Diagnosis not present

## 2019-09-10 DIAGNOSIS — S3992XA Unspecified injury of lower back, initial encounter: Secondary | ICD-10-CM | POA: Diagnosis present

## 2019-09-10 DIAGNOSIS — R52 Pain, unspecified: Secondary | ICD-10-CM | POA: Diagnosis not present

## 2019-09-10 DIAGNOSIS — R569 Unspecified convulsions: Secondary | ICD-10-CM | POA: Diagnosis not present

## 2019-09-10 DIAGNOSIS — M25552 Pain in left hip: Secondary | ICD-10-CM | POA: Diagnosis not present

## 2019-09-10 DIAGNOSIS — Y939 Activity, unspecified: Secondary | ICD-10-CM | POA: Insufficient documentation

## 2019-09-10 HISTORY — DX: Essential (primary) hypertension: I10

## 2019-09-10 LAB — COMPREHENSIVE METABOLIC PANEL
ALT: 37 U/L (ref 0–44)
AST: 34 U/L (ref 15–41)
Albumin: 3.8 g/dL (ref 3.5–5.0)
Alkaline Phosphatase: 85 U/L (ref 38–126)
Anion gap: 10 (ref 5–15)
BUN: 16 mg/dL (ref 8–23)
CO2: 24 mmol/L (ref 22–32)
Calcium: 9.7 mg/dL (ref 8.9–10.3)
Chloride: 104 mmol/L (ref 98–111)
Creatinine, Ser: 1.26 mg/dL — ABNORMAL HIGH (ref 0.61–1.24)
GFR calc Af Amer: >60 mL/min (ref >60)
GFR calc non Af Amer: 59 mL/min — ABNORMAL LOW (ref >60)
Glucose, Bld: 105 mg/dL — ABNORMAL HIGH (ref 70–99)
Potassium: 4 mmol/L (ref 3.5–5.1)
Sodium: 138 mmol/L (ref 135–145)
Total Bilirubin: 0.6 mg/dL (ref 0.3–1.2)
Total Protein: 7.7 g/dL (ref 6.5–8.1)

## 2019-09-10 LAB — I-STAT CHEM 8, ED
BUN: 19 mg/dL (ref 8–23)
Calcium, Ion: 1.18 mmol/L (ref 1.15–1.40)
Chloride: 101 mmol/L (ref 98–111)
Creatinine, Ser: 1.2 mg/dL (ref 0.61–1.24)
Glucose, Bld: 105 mg/dL — ABNORMAL HIGH (ref 70–99)
HCT: 47 % (ref 39.0–52.0)
Hemoglobin: 16 g/dL (ref 13.0–17.0)
Potassium: 4 mmol/L (ref 3.5–5.1)
Sodium: 138 mmol/L (ref 135–145)
TCO2: 24 mmol/L (ref 22–32)

## 2019-09-10 LAB — TYPE AND SCREEN
ABO/RH(D): A POS
Antibody Screen: NEGATIVE

## 2019-09-10 LAB — CBC
HCT: 45.2 % (ref 39.0–52.0)
Hemoglobin: 15 g/dL (ref 13.0–17.0)
MCH: 31 pg (ref 26.0–34.0)
MCHC: 33.2 g/dL (ref 30.0–36.0)
MCV: 93.4 fL (ref 80.0–100.0)
Platelets: 221 10*3/uL (ref 150–400)
RBC: 4.84 MIL/uL (ref 4.22–5.81)
RDW: 12 % (ref 11.5–15.5)
WBC: 4.9 10*3/uL (ref 4.0–10.5)
nRBC: 0 % (ref 0.0–0.2)

## 2019-09-10 LAB — URINALYSIS, ROUTINE W REFLEX MICROSCOPIC
Bilirubin Urine: NEGATIVE
Glucose, UA: NEGATIVE mg/dL
Hgb urine dipstick: NEGATIVE
Ketones, ur: NEGATIVE mg/dL
Leukocytes,Ua: NEGATIVE
Nitrite: NEGATIVE
Protein, ur: NEGATIVE mg/dL
Specific Gravity, Urine: 1.031 — ABNORMAL HIGH (ref 1.005–1.030)
pH: 7 (ref 5.0–8.0)

## 2019-09-10 LAB — PROTIME-INR
INR: 1 (ref 0.8–1.2)
Prothrombin Time: 13.5 seconds (ref 11.4–15.2)

## 2019-09-10 LAB — LACTIC ACID, PLASMA: Lactic Acid, Venous: 2.3 mmol/L (ref 0.5–1.9)

## 2019-09-10 LAB — CDS SEROLOGY

## 2019-09-10 LAB — ABO/RH: ABO/RH(D): A POS

## 2019-09-10 LAB — TROPONIN I (HIGH SENSITIVITY): Troponin I (High Sensitivity): 4 ng/L (ref <18)

## 2019-09-10 LAB — ETHANOL: Alcohol, Ethyl (B): <10 mg/dL (ref <10)

## 2019-09-10 MED ORDER — SODIUM CHLORIDE 0.9 % IV BOLUS
1000.0000 mL | Freq: Once | INTRAVENOUS | Status: AC
  Administered 2019-09-10: 1000 mL via INTRAVENOUS

## 2019-09-10 MED ORDER — OXYCODONE-ACETAMINOPHEN 5-325 MG PO TABS: 1.0000 | ORAL_TABLET | ORAL | 0 refills | Status: DC | PRN

## 2019-09-10 MED ORDER — IOHEXOL 300 MG/ML  SOLN
100.0000 mL | Freq: Once | INTRAMUSCULAR | Status: AC | PRN
  Administered 2019-09-10: 100 mL via INTRAVENOUS

## 2019-09-10 MED ORDER — LIDOCAINE 5 % EX PTCH: 1.0000 | MEDICATED_PATCH | CUTANEOUS | 0 refills | Status: DC

## 2019-09-10 MED ORDER — ONDANSETRON HCL 4 MG/2ML IJ SOLN: 4.0000 mg | Freq: Once | INTRAMUSCULAR | Status: DC

## 2019-09-10 MED ORDER — MORPHINE SULFATE (PF) 4 MG/ML IV SOLN
4.0000 mg | Freq: Once | INTRAVENOUS | Status: AC
  Administered 2019-09-10: 4 mg via INTRAVENOUS
  Filled 2019-09-10: qty 1

## 2019-09-10 MED ORDER — FENTANYL CITRATE (PF) 100 MCG/2ML IJ SOLN
50.0000 ug | Freq: Once | INTRAMUSCULAR | Status: AC
  Administered 2019-09-10: 50 ug via INTRAVENOUS
  Filled 2019-09-10: qty 2

## 2019-09-10 MED ORDER — ONDANSETRON HCL 4 MG PO TABS: 4.0000 mg | ORAL_TABLET | Freq: Three times a day (TID) | ORAL | 0 refills | Status: DC | PRN

## 2019-09-10 MED ORDER — ONDANSETRON HCL 4 MG/2ML IJ SOLN
INTRAMUSCULAR | Status: AC
  Filled 2019-09-10: qty 2

## 2019-09-10 MED ORDER — CYCLOBENZAPRINE HCL 10 MG PO TABS: 10.0000 mg | ORAL_TABLET | Freq: Two times a day (BID) | ORAL | 0 refills | Status: DC | PRN

## 2019-09-10 MED FILL — OXYCODONE-ACETAMINOPHEN 5-3: 5-325 | 3 days supply | Qty: 15 | Fill #0

## 2019-09-10 MED FILL — LIDOCAINE PATCH 5%: 5 | 30 days supply | Qty: 30 | Fill #0

## 2019-09-10 MED FILL — ONDANSETRON HCL 4 MG TABLET: 4 | 4 days supply | Qty: 12 | Fill #0

## 2019-09-10 MED FILL — CYCLOBENZAPRINE 10 MG TAB: 10 | 10 days supply | Qty: 20 | Fill #0
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
## 2019-09-10 NOTE — ED Notes (Signed)
Got pt up to get dressed and take IV out and pt had either syncopal episode or seizure.  Dr. Sherry Ruffing notified.  Pt to go into a room when available
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
## 2019-09-10 NOTE — Discharge Planning (Signed)
ED CM consulted by EDP [place] for medication assistance. EDCM reviewed chart and spoke with the pt about [facility] MATCH program ($3 co pay for each Rx through [facility] program, does not include refills, 7 day expiration of [place] letter and choice of pharmacies). Pt is eligible for [facility] MATCH program (unable to find pt listed in PROCARE per cardholder name inquiry) and has agreed to accept [place] under terms discussed with override of co-pay. PROCARE information entered. Council letter completed and provided to pt.  EDCM updated EDP and ED RN.   TOC pharmacy to fill and deliver to pt at bedside prior to discharge.

## 2019-09-10 NOTE — ED Notes (Signed)
Pt had an episode of nausea and dry heaving but has subsided. Zofran ordered but patient states he does not feel as though he needs it right now. Will hold it until patient needs it again.

## 2019-09-10 NOTE — ED Notes (Signed)
Patient verbalizes understanding of discharge instructions. Opportunity for questioning and answers were provided. Armband removed by staff, pt discharged from ED.  

## 2019-09-10 NOTE — ED Notes (Signed)
Pt log-rolled at this time. Reports tenderness to lower back.

## 2019-09-10 NOTE — ED Notes (Signed)
Patient transported to CT by Nira Conn, RN
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
## 2019-09-10 NOTE — ED Notes (Signed)
This RN has called pharmacy to verify zofran

## 2019-09-11 ENCOUNTER — Encounter: Payer: Self-pay | Admitting: Emergency Medicine

## 2019-09-26 ENCOUNTER — Other Ambulatory Visit: Payer: Self-pay

## 2019-09-26 DIAGNOSIS — Z20822 Contact with and (suspected) exposure to covid-19: Secondary | ICD-10-CM

## 2019-09-28 LAB — NOVEL CORONAVIRUS, NAA: SARS-CoV-2, NAA: DETECTED — AB

## 2019-10-08 ENCOUNTER — Other Ambulatory Visit: Payer: Self-pay

## 2019-10-08 DIAGNOSIS — Z20822 Contact with and (suspected) exposure to covid-19: Secondary | ICD-10-CM

## 2019-10-08 DIAGNOSIS — Z20828 Contact with and (suspected) exposure to other viral communicable diseases: Secondary | ICD-10-CM | POA: Diagnosis not present

## 2019-10-10 LAB — NOVEL CORONAVIRUS, NAA: SARS-CoV-2, NAA: NOT DETECTED

## 2020-02-02 DIAGNOSIS — E7849 Other hyperlipidemia: Secondary | ICD-10-CM | POA: Diagnosis not present

## 2020-02-02 DIAGNOSIS — Z125 Encounter for screening for malignant neoplasm of prostate: Secondary | ICD-10-CM | POA: Diagnosis not present

## 2020-02-02 DIAGNOSIS — I10 Essential (primary) hypertension: Secondary | ICD-10-CM | POA: Diagnosis not present

## 2020-02-02 DIAGNOSIS — J302 Other seasonal allergic rhinitis: Secondary | ICD-10-CM | POA: Diagnosis not present

## 2020-02-10 DIAGNOSIS — E7849 Other hyperlipidemia: Secondary | ICD-10-CM | POA: Diagnosis not present

## 2020-02-10 DIAGNOSIS — I10 Essential (primary) hypertension: Secondary | ICD-10-CM | POA: Diagnosis not present

## 2020-02-10 DIAGNOSIS — Z1211 Encounter for screening for malignant neoplasm of colon: Secondary | ICD-10-CM | POA: Diagnosis not present

## 2020-02-10 DIAGNOSIS — M19011 Primary osteoarthritis, right shoulder: Secondary | ICD-10-CM | POA: Diagnosis not present

## 2020-02-10 DIAGNOSIS — Z0000 Encounter for general adult medical examination without abnormal findings: Secondary | ICD-10-CM | POA: Diagnosis not present
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
## 2020-03-01 DIAGNOSIS — J302 Other seasonal allergic rhinitis: Secondary | ICD-10-CM | POA: Diagnosis not present

## 2020-03-01 DIAGNOSIS — E7849 Other hyperlipidemia: Secondary | ICD-10-CM | POA: Diagnosis not present

## 2020-03-01 DIAGNOSIS — I10 Essential (primary) hypertension: Secondary | ICD-10-CM | POA: Diagnosis not present

## 2020-03-01 DIAGNOSIS — M545 Low back pain: Secondary | ICD-10-CM | POA: Diagnosis not present

## 2020-04-09 DIAGNOSIS — I10 Essential (primary) hypertension: Secondary | ICD-10-CM | POA: Diagnosis not present

## 2020-04-09 DIAGNOSIS — E7849 Other hyperlipidemia: Secondary | ICD-10-CM | POA: Diagnosis not present

## 2020-04-09 DIAGNOSIS — M19011 Primary osteoarthritis, right shoulder: Secondary | ICD-10-CM | POA: Diagnosis not present

## 2020-04-09 DIAGNOSIS — M4306 Spondylolysis, lumbar region: Secondary | ICD-10-CM | POA: Diagnosis not present

## 2020-06-21 DIAGNOSIS — M179 Osteoarthritis of knee, unspecified: Secondary | ICD-10-CM | POA: Diagnosis not present

## 2020-06-21 DIAGNOSIS — J302 Other seasonal allergic rhinitis: Secondary | ICD-10-CM | POA: Diagnosis not present

## 2020-06-21 DIAGNOSIS — I10 Essential (primary) hypertension: Secondary | ICD-10-CM | POA: Diagnosis not present

## 2020-06-21 DIAGNOSIS — T24311D Burn of third degree of right thigh, subsequent encounter: Secondary | ICD-10-CM | POA: Diagnosis not present

## 2020-08-23 DIAGNOSIS — H524 Presbyopia: Secondary | ICD-10-CM | POA: Diagnosis not present

## 2020-08-23 DIAGNOSIS — H25813 Combined forms of age-related cataract, bilateral: Secondary | ICD-10-CM | POA: Diagnosis not present

## 2020-08-23 DIAGNOSIS — H5213 Myopia, bilateral: Secondary | ICD-10-CM | POA: Diagnosis not present

## 2020-08-23 DIAGNOSIS — H25812 Combined forms of age-related cataract, left eye: Secondary | ICD-10-CM | POA: Diagnosis not present

## 2020-08-25 DIAGNOSIS — E7849 Other hyperlipidemia: Secondary | ICD-10-CM | POA: Diagnosis not present

## 2020-08-25 DIAGNOSIS — Z23 Encounter for immunization: Secondary | ICD-10-CM | POA: Diagnosis not present

## 2020-08-25 DIAGNOSIS — M19011 Primary osteoarthritis, right shoulder: Secondary | ICD-10-CM | POA: Diagnosis not present

## 2020-08-25 DIAGNOSIS — N3001 Acute cystitis with hematuria: Secondary | ICD-10-CM | POA: Diagnosis not present

## 2020-08-25 DIAGNOSIS — M179 Osteoarthritis of knee, unspecified: Secondary | ICD-10-CM | POA: Diagnosis not present

## 2020-08-25 DIAGNOSIS — I10 Essential (primary) hypertension: Secondary | ICD-10-CM | POA: Diagnosis not present

## 2020-09-15 DIAGNOSIS — H2511 Age-related nuclear cataract, right eye: Secondary | ICD-10-CM | POA: Diagnosis not present

## 2020-09-15 DIAGNOSIS — H2512 Age-related nuclear cataract, left eye: Secondary | ICD-10-CM | POA: Diagnosis not present

## 2020-09-22 DIAGNOSIS — H25012 Cortical age-related cataract, left eye: Secondary | ICD-10-CM | POA: Diagnosis not present

## 2020-09-22 DIAGNOSIS — H2512 Age-related nuclear cataract, left eye: Secondary | ICD-10-CM | POA: Diagnosis not present

## 2020-12-10 DIAGNOSIS — M189 Osteoarthritis of first carpometacarpal joint, unspecified: Secondary | ICD-10-CM | POA: Diagnosis not present

## 2020-12-10 DIAGNOSIS — N3000 Acute cystitis without hematuria: Secondary | ICD-10-CM | POA: Diagnosis not present

## 2020-12-10 DIAGNOSIS — I10 Essential (primary) hypertension: Secondary | ICD-10-CM | POA: Diagnosis not present

## 2020-12-10 DIAGNOSIS — G5600 Carpal tunnel syndrome, unspecified upper limb: Secondary | ICD-10-CM | POA: Diagnosis not present

## 2021-01-20 DIAGNOSIS — H04123 Dry eye syndrome of bilateral lacrimal glands: Secondary | ICD-10-CM | POA: Diagnosis not present

## 2021-01-20 DIAGNOSIS — Z961 Presence of intraocular lens: Secondary | ICD-10-CM | POA: Diagnosis not present

## 2021-10-12 ENCOUNTER — Other Ambulatory Visit: Payer: Self-pay | Admitting: Internal Medicine

## 2021-10-12 DIAGNOSIS — R071 Chest pain on breathing: Secondary | ICD-10-CM | POA: Diagnosis not present

## 2021-10-12 DIAGNOSIS — E559 Vitamin D deficiency, unspecified: Secondary | ICD-10-CM | POA: Diagnosis not present

## 2021-10-12 DIAGNOSIS — Z0000 Encounter for general adult medical examination without abnormal findings: Secondary | ICD-10-CM | POA: Diagnosis not present

## 2021-10-12 DIAGNOSIS — I10 Essential (primary) hypertension: Secondary | ICD-10-CM | POA: Diagnosis not present

## 2021-10-12 DIAGNOSIS — Z125 Encounter for screening for malignant neoplasm of prostate: Secondary | ICD-10-CM | POA: Diagnosis not present

## 2021-10-12 DIAGNOSIS — Z23 Encounter for immunization: Secondary | ICD-10-CM | POA: Diagnosis not present

## 2021-10-12 DIAGNOSIS — E7849 Other hyperlipidemia: Secondary | ICD-10-CM | POA: Diagnosis not present

## 2021-10-12 DIAGNOSIS — Z131 Encounter for screening for diabetes mellitus: Secondary | ICD-10-CM | POA: Diagnosis not present

## 2021-10-13 LAB — COMPLETE METABOLIC PANEL WITH GFR
AG Ratio: 1.3 (calc) (ref 1.0–2.5)
ALT: 27 U/L (ref 9–46)
AST: 19 U/L (ref 10–35)
Albumin: 4 g/dL (ref 3.6–5.1)
Alkaline phosphatase (APISO): 76 U/L (ref 35–144)
BUN: 16 mg/dL (ref 7–25)
CO2: 23 mmol/L (ref 20–32)
Calcium: 9.9 mg/dL (ref 8.6–10.3)
Chloride: 104 mmol/L (ref 98–110)
Creat: 1.19 mg/dL (ref 0.70–1.35)
Globulin: 3.1 g/dL (calc) (ref 1.9–3.7)
Glucose, Bld: 100 mg/dL — ABNORMAL HIGH (ref 65–99)
Potassium: 3.8 mmol/L (ref 3.5–5.3)
Sodium: 139 mmol/L (ref 135–146)
Total Bilirubin: 0.3 mg/dL (ref 0.2–1.2)
Total Protein: 7.1 g/dL (ref 6.1–8.1)
eGFR: 67 mL/min/{1.73_m2} (ref >=60)

## 2021-10-13 LAB — CBC
HCT: 41.7 % (ref 38.5–50.0)
Hemoglobin: 14.2 g/dL (ref 13.2–17.1)
MCH: 31.1 pg (ref 27.0–33.0)
MCHC: 34.1 g/dL (ref 32.0–36.0)
MCV: 91.4 fL (ref 80.0–100.0)
MPV: 10.6 fL (ref 7.5–12.5)
Platelets: 215 10*3/uL (ref 140–400)
RBC: 4.56 Million/uL (ref 4.20–5.80)
RDW: 12.6 % (ref 11.0–15.0)
WBC: 5.5 10*3/uL (ref 3.8–10.8)

## 2021-10-13 LAB — LIPID PANEL
Cholesterol: 177 mg/dL
HDL: 48 mg/dL
LDL Cholesterol (Calc): 110 mg/dL — ABNORMAL HIGH
Non-HDL Cholesterol (Calc): 129 mg/dL
Total CHOL/HDL Ratio: 3.7 (calc)
Triglycerides: 93 mg/dL

## 2021-10-13 LAB — PSA: PSA: 0.59 ng/mL (ref <=4.00)

## 2021-10-13 LAB — VITAMIN D 25 HYDROXY (VIT D DEFICIENCY, FRACTURES): Vit D, 25-Hydroxy: 40 ng/mL (ref 30–100)

## 2021-10-13 LAB — TSH: TSH: 0.42 mIU/L (ref 0.40–4.50)

## 2021-11-24 ENCOUNTER — Other Ambulatory Visit: Payer: Self-pay | Admitting: Internal Medicine

## 2021-11-24 DIAGNOSIS — J302 Other seasonal allergic rhinitis: Secondary | ICD-10-CM | POA: Diagnosis not present

## 2021-11-24 DIAGNOSIS — M179 Osteoarthritis of knee, unspecified: Secondary | ICD-10-CM | POA: Diagnosis not present

## 2021-11-24 DIAGNOSIS — I10 Essential (primary) hypertension: Secondary | ICD-10-CM | POA: Diagnosis not present

## 2021-11-24 DIAGNOSIS — Z20822 Contact with and (suspected) exposure to covid-19: Secondary | ICD-10-CM | POA: Diagnosis not present

## 2021-11-25 LAB — SARS-COV-2 RNA,(COVID-19) QUALITATIVE NAAT: SARS CoV2 RNA: NOT DETECTED

## 2022-02-15 DIAGNOSIS — I10 Essential (primary) hypertension: Secondary | ICD-10-CM | POA: Diagnosis not present

## 2022-02-15 DIAGNOSIS — M179 Osteoarthritis of knee, unspecified: Secondary | ICD-10-CM | POA: Diagnosis not present

## 2022-02-15 DIAGNOSIS — J302 Other seasonal allergic rhinitis: Secondary | ICD-10-CM | POA: Diagnosis not present

## 2022-02-15 DIAGNOSIS — M4306 Spondylolysis, lumbar region: Secondary | ICD-10-CM | POA: Diagnosis not present

## 2022-05-16 DIAGNOSIS — M179 Osteoarthritis of knee, unspecified: Secondary | ICD-10-CM | POA: Diagnosis not present

## 2022-05-16 DIAGNOSIS — M4306 Spondylolysis, lumbar region: Secondary | ICD-10-CM | POA: Diagnosis not present

## 2022-05-16 DIAGNOSIS — I10 Essential (primary) hypertension: Secondary | ICD-10-CM | POA: Diagnosis not present

## 2022-05-16 DIAGNOSIS — M5431 Sciatica, right side: Secondary | ICD-10-CM | POA: Diagnosis not present

## 2022-05-30 ENCOUNTER — Emergency Department (HOSPITAL_COMMUNITY): Payer: BC Managed Care – PPO

## 2022-05-30 ENCOUNTER — Emergency Department (HOSPITAL_COMMUNITY)
Admission: EM | Admit: 2022-05-30 | Discharge: 2022-05-30 | Disposition: A | Discharge status: Home / Self Care | Payer: BC Managed Care – PPO | Attending: Emergency Medicine | Admitting: Emergency Medicine

## 2022-05-30 ENCOUNTER — Other Ambulatory Visit: Payer: Self-pay

## 2022-05-30 ENCOUNTER — Encounter (HOSPITAL_COMMUNITY): Payer: Self-pay

## 2022-05-30 DIAGNOSIS — M542 Cervicalgia: Secondary | ICD-10-CM | POA: Diagnosis not present

## 2022-05-30 DIAGNOSIS — S4991XA Unspecified injury of right shoulder and upper arm, initial encounter: Secondary | ICD-10-CM | POA: Diagnosis not present

## 2022-05-30 DIAGNOSIS — M19011 Primary osteoarthritis, right shoulder: Secondary | ICD-10-CM | POA: Diagnosis not present

## 2022-05-30 DIAGNOSIS — M4807 Spinal stenosis, lumbosacral region: Secondary | ICD-10-CM | POA: Diagnosis not present

## 2022-05-30 DIAGNOSIS — Z041 Encounter for examination and observation following transport accident: Secondary | ICD-10-CM | POA: Diagnosis not present

## 2022-05-30 DIAGNOSIS — Z79899 Other long term (current) drug therapy: Secondary | ICD-10-CM | POA: Diagnosis not present

## 2022-05-30 DIAGNOSIS — Y92410 Unspecified street and highway as the place of occurrence of the external cause: Secondary | ICD-10-CM | POA: Diagnosis not present

## 2022-05-30 DIAGNOSIS — M25551 Pain in right hip: Secondary | ICD-10-CM | POA: Diagnosis not present

## 2022-05-30 DIAGNOSIS — M545 Low back pain, unspecified: Secondary | ICD-10-CM | POA: Insufficient documentation

## 2022-05-30 DIAGNOSIS — M4306 Spondylolysis, lumbar region: Secondary | ICD-10-CM | POA: Diagnosis not present

## 2022-05-30 DIAGNOSIS — M48061 Spinal stenosis, lumbar region without neurogenic claudication: Secondary | ICD-10-CM | POA: Diagnosis not present

## 2022-05-30 DIAGNOSIS — I10 Essential (primary) hypertension: Secondary | ICD-10-CM | POA: Diagnosis not present

## 2022-05-30 DIAGNOSIS — M25511 Pain in right shoulder: Secondary | ICD-10-CM | POA: Diagnosis not present

## 2022-05-30 DIAGNOSIS — S0990XA Unspecified injury of head, initial encounter: Secondary | ICD-10-CM | POA: Diagnosis not present

## 2022-05-30 DIAGNOSIS — M25519 Pain in unspecified shoulder: Secondary | ICD-10-CM | POA: Diagnosis not present

## 2022-05-30 DIAGNOSIS — M25521 Pain in right elbow: Secondary | ICD-10-CM | POA: Insufficient documentation

## 2022-05-30 DIAGNOSIS — S3993XA Unspecified injury of pelvis, initial encounter: Secondary | ICD-10-CM | POA: Diagnosis not present

## 2022-05-30 MED ORDER — OXYCODONE HCL 5 MG PO TABS: 5.0000 mg | ORAL_TABLET | ORAL | 0 refills | Status: DC | PRN

## 2022-05-30 MED ORDER — OXYCODONE-ACETAMINOPHEN 5-325 MG PO TABS
1.0000 | ORAL_TABLET | Freq: Once | ORAL | Status: AC
  Administered 2022-05-30: 1 via ORAL
  Filled 2022-05-30: qty 1
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
## 2022-05-30 NOTE — Discharge Instructions (Addendum)
Note your work-up today was overall negative for any acute abnormality.  You do have multiple areas of osteoarthritis and bony deposits throughout your spine and pelvis.  Your work-up today did not necessarily rule out a rotator cuff injury on your right shoulder.  I recommend follow-up with an orthopedic in 5 to 7 days for reevaluation of your right shoulder.  I have placed information in your discharge papers to call [facility] to set up an appointment for 1 weeks time.  Given your chronic back pain and findings on CT this morning, I recommend follow-up with neurosurgery in 5 to 7 days as well.  You can take the pain medication prescription prescribed as needed for pain.  Take diclofenac/Voltaren at home; this will help with pain as well as inflammation.  Rest, ice, elevate affected areas along with taking ibuprofen.  Please not hesitate to return to the emergency department if the worrisome signs and symptoms we discussed become apparent.

## 2022-05-30 NOTE — ED Notes (Signed)
Shoulder and back pain is about 8 per pt.  He is setting up and he is moving all extremities
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
## 2022-06-14 DIAGNOSIS — M25551 Pain in right hip: Secondary | ICD-10-CM | POA: Diagnosis not present

## 2022-06-14 DIAGNOSIS — I10 Essential (primary) hypertension: Secondary | ICD-10-CM | POA: Diagnosis not present

## 2022-06-14 DIAGNOSIS — M179 Osteoarthritis of knee, unspecified: Secondary | ICD-10-CM | POA: Diagnosis not present

## 2022-06-14 DIAGNOSIS — M19011 Primary osteoarthritis, right shoulder: Secondary | ICD-10-CM | POA: Diagnosis not present

## 2022-06-19 DIAGNOSIS — M47816 Spondylosis without myelopathy or radiculopathy, lumbar region: Secondary | ICD-10-CM | POA: Diagnosis not present

## 2022-07-27 DIAGNOSIS — M25551 Pain in right hip: Secondary | ICD-10-CM | POA: Diagnosis not present

## 2022-07-27 DIAGNOSIS — M4306 Spondylolysis, lumbar region: Secondary | ICD-10-CM | POA: Diagnosis not present

## 2022-07-27 DIAGNOSIS — M19011 Primary osteoarthritis, right shoulder: Secondary | ICD-10-CM | POA: Diagnosis not present

## 2022-10-11 ENCOUNTER — Other Ambulatory Visit: Payer: Self-pay | Admitting: Internal Medicine

## 2022-10-12 LAB — COMPLETE METABOLIC PANEL WITHOUT GFR
AG Ratio: 1.5 (calc) (ref 1.0–2.5)
ALT: 39 U/L (ref 9–46)
AST: 23 U/L (ref 10–35)
Albumin: 4.4 g/dL (ref 3.6–5.1)
Alkaline phosphatase (APISO): 100 U/L (ref 35–144)
BUN: 19 mg/dL (ref 7–25)
CO2: 22 mmol/L (ref 20–32)
Calcium: 10.3 mg/dL (ref 8.6–10.3)
Chloride: 105 mmol/L (ref 98–110)
Creat: 1.13 mg/dL (ref 0.70–1.35)
Globulin: 3 g/dL (ref 1.9–3.7)
Glucose, Bld: 83 mg/dL (ref 65–99)
Potassium: 4.4 mmol/L (ref 3.5–5.3)
Sodium: 139 mmol/L (ref 135–146)
Total Bilirubin: 0.4 mg/dL (ref 0.2–1.2)
Total Protein: 7.4 g/dL (ref 6.1–8.1)
eGFR: 70 mL/min/1.73m2

## 2022-10-12 LAB — LIPID PANEL
Cholesterol: 222 mg/dL — ABNORMAL HIGH
HDL: 40 mg/dL
LDL Cholesterol (Calc): 146 mg/dL — ABNORMAL HIGH
Non-HDL Cholesterol (Calc): 182 mg/dL — ABNORMAL HIGH
Total CHOL/HDL Ratio: 5.6 (calc) — ABNORMAL HIGH
Triglycerides: 215 mg/dL — ABNORMAL HIGH

## 2022-10-12 LAB — CBC
HCT: 42.7 % (ref 38.5–50.0)
Hemoglobin: 14.9 g/dL (ref 13.2–17.1)
MCH: 31.5 pg (ref 27.0–33.0)
MCHC: 34.9 g/dL (ref 32.0–36.0)
MCV: 90.3 fL (ref 80.0–100.0)
MPV: 10.7 fL (ref 7.5–12.5)
Platelets: 220 Thousand/uL (ref 140–400)
RBC: 4.73 Million/uL (ref 4.20–5.80)
RDW: 12.5 % (ref 11.0–15.0)
WBC: 5.1 Thousand/uL (ref 3.8–10.8)

## 2022-10-12 LAB — TSH: TSH: 0.56 m[IU]/L (ref 0.40–4.50)

## 2022-10-12 LAB — VITAMIN D 25 HYDROXY (VIT D DEFICIENCY, FRACTURES): Vit D, 25-Hydroxy: 31 ng/mL (ref 30–100)

## 2022-10-12 LAB — PSA: PSA: 0.6 ng/mL (ref <=4.00)

## 2023-02-12 ENCOUNTER — Emergency Department (HOSPITAL_COMMUNITY)
Admission: EM | Admit: 2023-02-12 | Discharge: 2023-02-13 | Disposition: A | Discharge status: Home / Self Care | Payer: No Typology Code available for payment source | Attending: Emergency Medicine | Admitting: Emergency Medicine

## 2023-02-12 ENCOUNTER — Other Ambulatory Visit: Payer: Self-pay

## 2023-02-12 DIAGNOSIS — R197 Diarrhea, unspecified: Secondary | ICD-10-CM | POA: Diagnosis not present

## 2023-02-12 DIAGNOSIS — R112 Nausea with vomiting, unspecified: Secondary | ICD-10-CM | POA: Diagnosis present

## 2023-02-12 DIAGNOSIS — I10 Essential (primary) hypertension: Secondary | ICD-10-CM | POA: Diagnosis not present

## 2023-02-12 LAB — CBC
HCT: 41.8 % (ref 39.0–52.0)
Hemoglobin: 14.2 g/dL (ref 13.0–17.0)
MCH: 30.9 pg (ref 26.0–34.0)
MCHC: 34 g/dL (ref 30.0–36.0)
MCV: 90.9 fL (ref 80.0–100.0)
Platelets: 235 10*3/uL (ref 150–400)
RBC: 4.6 MIL/uL (ref 4.22–5.81)
RDW: 12.9 % (ref 11.5–15.5)
WBC: 8.2 10*3/uL (ref 4.0–10.5)
nRBC: 0 % (ref 0.0–0.2)

## 2023-02-12 LAB — COMPREHENSIVE METABOLIC PANEL
ALT: 44 U/L (ref 0–44)
AST: 28 U/L (ref 15–41)
Albumin: 3.5 g/dL (ref 3.5–5.0)
Alkaline Phosphatase: 84 U/L (ref 38–126)
Anion gap: 10 (ref 5–15)
BUN: 17 mg/dL (ref 8–23)
CO2: 21 mmol/L — ABNORMAL LOW (ref 22–32)
Calcium: 9.2 mg/dL (ref 8.9–10.3)
Chloride: 105 mmol/L (ref 98–111)
Creatinine, Ser: 1.19 mg/dL (ref 0.61–1.24)
GFR, Estimated: >60 mL/min (ref >60)
Glucose, Bld: 106 mg/dL — ABNORMAL HIGH (ref 70–99)
Potassium: 4 mmol/L (ref 3.5–5.1)
Sodium: 136 mmol/L (ref 135–145)
Total Bilirubin: 0.5 mg/dL (ref 0.3–1.2)
Total Protein: 7.2 g/dL (ref 6.5–8.1)

## 2023-02-12 LAB — URINALYSIS, ROUTINE W REFLEX MICROSCOPIC
Bilirubin Urine: NEGATIVE
Glucose, UA: NEGATIVE mg/dL
Hgb urine dipstick: NEGATIVE
Ketones, ur: NEGATIVE mg/dL
Leukocytes,Ua: NEGATIVE
Nitrite: NEGATIVE
Protein, ur: 30 mg/dL — AB
Specific Gravity, Urine: 1.028 (ref 1.005–1.030)
pH: 5 (ref 5.0–8.0)

## 2023-02-12 LAB — LIPASE, BLOOD: Lipase: 26 U/L (ref 11–51)

## 2023-02-12 MED ORDER — LOPERAMIDE HCL 2 MG PO CAPS
2.0000 mg | ORAL_CAPSULE | Freq: Four times a day (QID) | ORAL | 0 refills | Status: DC | PRN
  Filled 2023-02-12: qty 12, 3d supply, fill #0

## 2023-02-12 MED ORDER — ONDANSETRON 4 MG PO TBDP
4.0000 mg | ORAL_TABLET | Freq: Three times a day (TID) | ORAL | 0 refills | Status: DC | PRN
  Filled 2023-02-12: qty 20, 7d supply, fill #0

## 2023-02-12 MED ORDER — ONDANSETRON 4 MG PO TBDP
4.0000 mg | ORAL_TABLET | Freq: Once | ORAL | Status: AC
  Administered 2023-02-12: 4 mg via ORAL
  Filled 2023-02-12: qty 1

## 2023-02-12 NOTE — ED Provider Notes (Signed)
[facility]-EMERGENCY DEPT [facility] Emergency Department Provider Note MRN:  [id number]  Arrival date & time: 02/12/23     Chief Complaint   Emesis and Diarrhea   History of Present Illness   Brent Norman is a 72 y.o. year-old male with a history of hypertension presenting to the ED with chief complaint of vomiting or diarrhea.  Nausea vomiting and diarrhea throughout the day.  No abdominal pain, not sure what to do at home, trouble eating anything without immediate diarrhea.  Review of Systems  A thorough review of systems was obtained and all systems are negative except as noted in the HPI and PMH.   Patient's Health History    Past Medical History:  Diagnosis Date   Arthritis    Chronic back pain    DVT (deep venous thrombosis) (HCC)    HTN (hypertension)     Past Surgical History:  Procedure Laterality Date   APPENDECTOMY      No family history on file.  Social History   Socioeconomic History   Marital status: Married    Spouse name: Not on file   Number of children: Not on file   Years of education: Not on file   Highest education level: Not on file  Occupational History   Not on file  Tobacco Use   Smoking status: Never   Smokeless tobacco: Never  Substance and Sexual Activity   Alcohol use: Never   Drug use: No   Sexual activity: Never  Other Topics Concern   Not on file  Social History Narrative   ** Merged History Encounter **       Social Determinants of Health   Financial Resource Strain: Not on file  Food Insecurity: Not on file  Transportation Needs: Not on file  Physical Activity: Not on file  Stress: Not on file  Social Connections: Not on file  Intimate Partner Violence: Not on file     Physical Exam   Vitals:   02/12/23 2133  BP: 102/60  Pulse: 78  Resp: 20  Temp: 99.2 F (37.3 C)  SpO2: 96%    CONSTITUTIONAL: Well-appearing, NAD NEURO/PSYCH:  Alert and oriented x 3, no focal deficits EYES:  eyes equal and  reactive ENT/NECK:  no LAD, no JVD CARDIO: Regular rate, well-perfused, normal S1 and S2 PULM:  CTAB no wheezing or rhonchi GI/GU:  non-distended, non-tender MSK/SPINE:  No gross deformities, no edema SKIN:  no rash, atraumatic   *Additional and/or pertinent findings included in MDM below  Diagnostic and Interventional Summary    EKG Interpretation  Date/Time:    Ventricular Rate:    PR Interval:    QRS Duration:   QT Interval:    QTC Calculation:   R Axis:     Text Interpretation:         Labs Reviewed  COMPREHENSIVE METABOLIC PANEL - Abnormal; Notable for the following components:      Result Value   CO2 21 (*)    Glucose, Bld 106 (*)    All other components within normal limits  URINALYSIS, ROUTINE W REFLEX MICROSCOPIC - Abnormal; Notable for the following components:   APPearance HAZY (*)    Protein, ur 30 (*)    Bacteria, UA RARE (*)    All other components within normal limits  LIPASE, BLOOD  CBC    No orders to display    Medications  ondansetron (ZOFRAN-ODT) disintegrating tablet 4 mg (has no administration in time range)  Procedures  /  Critical Care Procedures  ED Course and Medical Decision Making  Initial Impression and Ddx Well-appearing with normal vital signs, soft abdomen with no rebound guarding or rigidity.  Not complaining of any abdominal pain.  Wife has the exact same symptoms today and also here in the emergency department for evaluation.  And so highly suggestive of infectious diarrhea.  Past medical/surgical history that increases complexity of ED encounter: Hypertension  Interpretation of Diagnostics I personally reviewed the laboratory assessment and my interpretation is as follows: No significant blood count or electrolyte disturbance    Patient Reassessment and Ultimate Disposition/Management     No signs of emergent process given the reassuring exam and laboratory evaluation, no indication for advanced imaging,  appropriate for discharge.  Patient management required discussion with the following services or consulting groups:  None  Complexity of Problems Addressed Acute illness or injury that poses threat of life of bodily function  Additional Data Reviewed and Analyzed Further history obtained from: Further history from spouse/family member  Additional Factors Impacting ED Encounter Risk Prescriptions  Elmer Sow. Pilar Plate, MD [facility] Emergency Medicine [facility] [email or web address]  Final Clinical Impressions(s) / ED Diagnoses     ICD-10-CM   1. Nausea vomiting and diarrhea  R11.2    R19.7       ED Discharge Orders          Ordered    loperamide (IMODIUM) 2 MG capsule  4 times daily PRN        02/12/23 2350    ondansetron (ZOFRAN-ODT) 4 MG disintegrating tablet  Every 8 hours PRN        02/12/23 2350             Discharge Instructions Discussed with and Provided to Patient:    Discharge Instructions      You were evaluated in the Emergency Department and after careful evaluation, we did not find any emergent condition requiring admission or further testing in the hospital.  Your exam/testing today is overall reassuring.  Symptoms likely due to a stomach bug or viral infection.  Use the Zofran medication as needed for nausea, use the Imodium as needed for diarrhea.  Please return to the Emergency Department if you experience any worsening of your condition.   Thank you for allowing [place] to be a part of your care.      Sabas Sous, MD 02/12/23 [phone]

## 2023-02-12 NOTE — ED Triage Notes (Signed)
Pt states he and his wife have had n/v/d since Sunday.  Cannot hold down daily medications.

## 2023-02-12 NOTE — Discharge Instructions (Signed)
You were evaluated in the Emergency Department and after careful evaluation, we did not find any emergent condition requiring admission or further testing in the hospital.  Your exam/testing today is overall reassuring.  Symptoms likely due to a stomach bug or viral infection.  Use the Zofran medication as needed for nausea, use the Imodium as needed for diarrhea.  Please return to the Emergency Department if you experience any worsening of your condition.   Thank you for allowing [place] to be a part of your care.

## 2023-02-13 ENCOUNTER — Other Ambulatory Visit (HOSPITAL_COMMUNITY): Payer: Self-pay

## 2023-02-13 MED ORDER — LOPERAMIDE HCL 2 MG PO CAPS: 2.0000 mg | ORAL_CAPSULE | Freq: Four times a day (QID) | ORAL | 0 refills | Status: DC | PRN

## 2023-02-13 MED ORDER — ONDANSETRON 4 MG PO TBDP: 4.0000 mg | ORAL_TABLET | Freq: Three times a day (TID) | ORAL | 0 refills | Status: DC | PRN

## 2023-04-24 ENCOUNTER — Other Ambulatory Visit: Payer: Self-pay

## 2023-04-24 ENCOUNTER — Encounter (HOSPITAL_COMMUNITY): Payer: Self-pay

## 2023-04-24 ENCOUNTER — Emergency Department (HOSPITAL_COMMUNITY): Payer: No Typology Code available for payment source

## 2023-04-24 ENCOUNTER — Emergency Department (HOSPITAL_COMMUNITY)
Admission: EM | Admit: 2023-04-24 | Discharge: 2023-04-24 | Disposition: A | Discharge status: Home / Self Care | Payer: No Typology Code available for payment source | Attending: Student | Admitting: Student

## 2023-04-24 DIAGNOSIS — Z79899 Other long term (current) drug therapy: Secondary | ICD-10-CM | POA: Diagnosis not present

## 2023-04-24 DIAGNOSIS — I10 Essential (primary) hypertension: Secondary | ICD-10-CM | POA: Insufficient documentation

## 2023-04-24 DIAGNOSIS — R0789 Other chest pain: Secondary | ICD-10-CM | POA: Insufficient documentation

## 2023-04-24 LAB — BASIC METABOLIC PANEL
Anion gap: 10 (ref 5–15)
BUN: 13 mg/dL (ref 8–23)
CO2: 22 mmol/L (ref 22–32)
Calcium: 8.9 mg/dL (ref 8.9–10.3)
Chloride: 103 mmol/L (ref 98–111)
Creatinine, Ser: 1.05 mg/dL (ref 0.61–1.24)
GFR, Estimated: >60 mL/min (ref >60)
Glucose, Bld: 98 mg/dL (ref 70–99)
Potassium: 3.2 mmol/L — ABNORMAL LOW (ref 3.5–5.1)
Sodium: 135 mmol/L (ref 135–145)

## 2023-04-24 LAB — CBC
HCT: 42.5 % (ref 39.0–52.0)
Hemoglobin: 13.9 g/dL (ref 13.0–17.0)
MCH: 31.3 pg (ref 26.0–34.0)
MCHC: 32.7 g/dL (ref 30.0–36.0)
MCV: 95.7 fL (ref 80.0–100.0)
Platelets: 196 10*3/uL (ref 150–400)
RBC: 4.44 MIL/uL (ref 4.22–5.81)
RDW: 12.4 % (ref 11.5–15.5)
WBC: 6.8 10*3/uL (ref 4.0–10.5)
nRBC: 0 % (ref 0.0–0.2)

## 2023-04-24 LAB — TROPONIN I (HIGH SENSITIVITY): Troponin I (High Sensitivity): 6 ng/L (ref <18)

## 2023-04-24 MED ORDER — LIDOCAINE 5 % EX PTCH
1.0000 | MEDICATED_PATCH | CUTANEOUS | Status: DC
  Administered 2023-04-24: 1 via TRANSDERMAL
  Filled 2023-04-24: qty 1

## 2023-04-24 MED ORDER — NAPROXEN 250 MG PO TABS
500.0000 mg | ORAL_TABLET | Freq: Once | ORAL | Status: AC
  Administered 2023-04-24: 500 mg via ORAL
  Filled 2023-04-24: qty 2

## 2023-04-24 NOTE — ED Triage Notes (Signed)
Pt arrived from home via POV c/o chest pain soreness that  began yesterday. Pt states that it just feels like a soreness. Denies any other symptoms

## 2023-04-25 NOTE — ED Provider Notes (Signed)
[place] EM[facility] DEPARTMENT AT [facility] Provider Note  CSN: [id number] Arrival date & time: 04/24/23 0305  Chief Complaint(s) Chest Pain  HPI Brent Norman is a 72 y.o. male with PMH HTN, arthritis, DVT no longer on anticoagulation who presents emergency room for evaluation of right-sided chest pain.  Patient states that he started to develop a soreness on the right side of his chest over the last 24 hours.  He denies associated shortness of breath, diaphoresis, nausea, vomiting or any exertional component to the pain.  Pain is worse with movement and twisting of the chest wall.  Denies known trauma to the chest.  Denies cough, abdominal pain, nausea, vomiting, headache, fever or other systemic symptoms.   Past Medical History Past Medical History:  Diagnosis Date   Arthritis    Chronic back pain    DVT (deep venous thrombosis) (HCC)    HTN (hypertension)    Patient Active Problem List   Diagnosis Date Noted   Syncope 12/03/2016   Home Medication(s) Prior to Admission medications   Medication Sig Start Date End Date Taking? Authorizing Provider  cetirizine (ZYRTEC) 10 MG tablet Take 1 tablet (10 mg total) by mouth daily. 01/09/18   Derwood Kaplan, MD  diclofenac (VOLTAREN) 75 MG EC tablet Take 75 mg by mouth 2 (two) times daily as needed for pain. 05/16/22   [provider]  hydrochlorothiazide (HYDRODIURIL) 12.5 MG tablet Take 1 tablet (12.5 mg total) by mouth daily. 05/31/18   Minna Antis, MD  loperamide (IMODIUM) 2 MG capsule Take 1 capsule (2 mg total) by mouth 4 (four) times daily as needed for diarrhea or loose stools. 02/13/23   Sabas Sous, MD  Multiple Vitamin (MULTIVITAMIN) tablet Take 1 tablet by mouth daily.    [provider]  ondansetron (ZOFRAN-ODT) 4 MG disintegrating tablet Take 1 tablet (4 mg total) by mouth every 8 (eight) hours as needed for nausea or vomiting. 02/13/23   Sabas Sous, MD  oxyCODONE (ROXICODONE) 5 MG  immediate release tablet Take 1 tablet (5 mg total) by mouth every 4 (four) hours as needed for severe pain. 05/30/22   Peter Garter, PA  potassium chloride (KLOR-CON) 10 MEQ tablet Take 10 mEq by mouth daily. 05/16/22   [provider]  tiZANidine (ZANAFLEX) 4 MG tablet Take 4 mg by mouth at bedtime as needed for muscle spasms. 05/16/22   [provider]                                                                                                                                    Past Surgical History Past Surgical History:  Procedure Laterality Date   APPENDECTOMY     Family History History reviewed. No pertinent family history.  Social History Social History   Tobacco Use   Smoking status: Never   Smokeless tobacco: Never  Substance Use Topics   Alcohol use:  Never   Drug use: No   Allergies Patient has no known allergies.  Review of Systems Review of Systems  Cardiovascular:  Positive for chest pain.    Physical Exam Vital Signs  I have reviewed the triage vital signs BP 134/84   Pulse 62   Temp 98 F (36.7 C)   Resp (!) 24   SpO2 96%   Physical Exam Constitutional:      General: He is not in acute distress.    Appearance: Normal appearance.  HENT:     Head: Normocephalic and atraumatic.     Nose: No congestion or rhinorrhea.  Eyes:     General:        Right eye: No discharge.        Left eye: No discharge.     Extraocular Movements: Extraocular movements intact.     Pupils: Pupils are equal, round, and reactive to light.  Cardiovascular:     Rate and Rhythm: Normal rate and regular rhythm.     Heart sounds: No murmur heard. Pulmonary:     Effort: No respiratory distress.     Breath sounds: No wheezing or rales.  Chest:     Chest wall: Tenderness present.  Abdominal:     General: There is no distension.     Tenderness: There is no abdominal tenderness.  Musculoskeletal:        General: Normal range of motion.     Cervical  back: Normal range of motion.  Skin:    General: Skin is warm and dry.  Neurological:     General: No focal deficit present.     Mental Status: He is alert.     ED Results and Treatments Labs (all labs ordered are listed, but only abnormal results are displayed) Labs Reviewed  BASIC METABOLIC PANEL - Abnormal; Notable for the following components:      Result Value   Potassium 3.2 (*)    All other components within normal limits  CBC  TROPONIN I (HIGH SENSITIVITY)                                                                                                                          Radiology DG Chest 2 View  Result Date: 04/24/2023 CLINICAL DATA:  Chest pain EXAM: CHEST - 2 VIEW COMPARISON:  09/10/2019 FINDINGS: The heart size and mediastinal contours are within normal limits. Both lungs are clear. The visualized skeletal structures are unremarkable. IMPRESSION: No active cardiopulmonary disease. Electronically Signed   By: Alcide Clever M.D.   On: 04/24/2023 04:02    Pertinent labs & imaging results that were available during my care of the patient were reviewed by me and considered in my medical decision making (see MDM for details).  Medications Ordered in ED Medications  naproxen (NAPROSYN) tablet 500 mg (500 mg Oral Given 04/24/23 1610)  Procedures Procedures  (including critical care time)  Medical Decision Making / ED Course   This patient presents to the ED for concern of chest pain, this involves an extensive number of treatment options, and is a complaint that carries with it a high risk of complications and morbidity.  The differential diagnosis includes ACS, Aortic Dissection, Pneumothorax, Pneumonia, Esophageal Rupture, PE, Tamponade/Pericardial Effusion, pericarditis, esophageal spasm, dysrhythmia, GERD,  costochondritis.  MDM: Patient seen emergency room for evaluation of chest pain.  Physical exam with reproducible chest wall tenderness at the insertion site of the rib on the right side of the sternum.  ECG nonischemic, no ST elevations or depressions.  Chest x-ray is unremarkable.  Laboratory evaluation unremarkable including negative high-sensitivity troponin.  Patient given a Lidoderm patch and naproxen and on reevaluation symptoms have resolved.  Patient low risk by Wells criteria and I have overall low suspicion for PE especially in the setting of no shortness of breath, tachycardia or hypoxia.  Heart score is 3 and I have overall low suspicion for ACS especially in the setting of reproducible right-sided chest pain with no accompanying symptoms.  At this time, patient presentation more consistent with costochondritis and he will discharge with an NSAID regimen.  Patient given return precautions to which he voiced understanding he was discharged.   Additional history obtained:  -External records from outside source obtained and reviewed including: Chart review including previous notes, labs, imaging, consultation notes   Lab Tests: -I ordered, reviewed, and interpreted labs.   The pertinent results include:   Labs Reviewed  BASIC METABOLIC PANEL - Abnormal; Notable for the following components:      Result Value   Potassium 3.2 (*)    All other components within normal limits  CBC  TROPONIN I (HIGH SENSITIVITY)      EKG   EKG Interpretation  Date/Time:  Tuesday April 24 2023 04:34:10 EDT Ventricular Rate:  59 PR Interval:  164 QRS Duration: 95 QT Interval:  441 QTC Calculation: 437 R Axis:   -8 Text Interpretation: Normal sinus rhythm Confirmed by Adelma Bowdoin ([id number]) on 04/25/2023 12:25:26 AM         Imaging Studies ordered: I ordered imaging studies including chest x-ray I independently visualized and interpreted imaging. I agree with the radiologist  interpretation   Medicines ordered and prescription drug management: Meds ordered this encounter  Medications   DISCONTD: lidocaine (LIDODERM) 5 % 1 patch   naproxen (NAPROSYN) tablet 500 mg    -I have reviewed the patients home medicines and have made adjustments as needed  Critical interventions none    Cardiac Monitoring: The patient was maintained on a cardiac monitor.  I personally viewed and interpreted the cardiac monitored which showed an underlying rhythm of: NSR  Social Determinants of Health:  Factors impacting patients care include: none   Reevaluation: After the interventions noted above, I reevaluated the patient and found that they have :improved  Co morbidities that complicate the patient evaluation  Past Medical History:  Diagnosis Date   Arthritis    Chronic back pain    DVT (deep venous thrombosis) (HCC)    HTN (hypertension)       Dispostion: I considered admission for this patient, but with symptoms resolved and low suspicion for PE or ACS patient safe for discharge with outpatient follow-up and return precautions     Final Clinical Impression(s) / ED Diagnoses Final diagnoses:  Chest wall pain     @PCDICTATION @    Tima Curet, [place],  MD 04/25/23 4098

## 2023-04-30 ENCOUNTER — Emergency Department (HOSPITAL_COMMUNITY): Payer: No Typology Code available for payment source

## 2023-04-30 ENCOUNTER — Other Ambulatory Visit: Payer: Self-pay

## 2023-04-30 ENCOUNTER — Inpatient Hospital Stay (HOSPITAL_COMMUNITY)
Admission: EM | Admit: 2023-04-30 | Discharge: 2023-05-02 | DRG: 066 | Disposition: A | Discharge status: Home Health | Payer: No Typology Code available for payment source | Attending: Internal Medicine | Admitting: Internal Medicine

## 2023-04-30 ENCOUNTER — Inpatient Hospital Stay (HOSPITAL_COMMUNITY): Payer: No Typology Code available for payment source

## 2023-04-30 DIAGNOSIS — R531 Weakness: Principal | ICD-10-CM

## 2023-04-30 DIAGNOSIS — I639 Cerebral infarction, unspecified: Secondary | ICD-10-CM | POA: Diagnosis not present

## 2023-04-30 DIAGNOSIS — Z9049 Acquired absence of other specified parts of digestive tract: Secondary | ICD-10-CM

## 2023-04-30 DIAGNOSIS — I63211 Cerebral infarction due to unspecified occlusion or stenosis of right vertebral arteries: Secondary | ICD-10-CM | POA: Diagnosis present

## 2023-04-30 DIAGNOSIS — M549 Dorsalgia, unspecified: Secondary | ICD-10-CM | POA: Diagnosis present

## 2023-04-30 DIAGNOSIS — Z79899 Other long term (current) drug therapy: Secondary | ICD-10-CM

## 2023-04-30 DIAGNOSIS — E785 Hyperlipidemia, unspecified: Secondary | ICD-10-CM | POA: Diagnosis present

## 2023-04-30 DIAGNOSIS — W2201XA Walked into wall, initial encounter: Secondary | ICD-10-CM | POA: Diagnosis present

## 2023-04-30 DIAGNOSIS — Z1152 Encounter for screening for COVID-19: Secondary | ICD-10-CM

## 2023-04-30 DIAGNOSIS — Z6833 Body mass index (BMI) 33.0-33.9, adult: Secondary | ICD-10-CM | POA: Diagnosis not present

## 2023-04-30 DIAGNOSIS — R29701 NIHSS score 1: Secondary | ICD-10-CM | POA: Diagnosis present

## 2023-04-30 DIAGNOSIS — I10 Essential (primary) hypertension: Secondary | ICD-10-CM | POA: Diagnosis present

## 2023-04-30 DIAGNOSIS — R260 Ataxic gait: Secondary | ICD-10-CM | POA: Diagnosis present

## 2023-04-30 DIAGNOSIS — E6609 Other obesity due to excess calories: Secondary | ICD-10-CM | POA: Diagnosis present

## 2023-04-30 DIAGNOSIS — I6389 Other cerebral infarction: Secondary | ICD-10-CM | POA: Diagnosis not present

## 2023-04-30 DIAGNOSIS — G8929 Other chronic pain: Secondary | ICD-10-CM | POA: Diagnosis present

## 2023-04-30 DIAGNOSIS — E7849 Other hyperlipidemia: Secondary | ICD-10-CM | POA: Diagnosis not present

## 2023-04-30 LAB — BASIC METABOLIC PANEL
Anion gap: 9 (ref 5–15)
BUN: 14 mg/dL (ref 8–23)
CO2: 22 mmol/L (ref 22–32)
Calcium: 9.3 mg/dL (ref 8.9–10.3)
Chloride: 102 mmol/L (ref 98–111)
Creatinine, Ser: 1.13 mg/dL (ref 0.61–1.24)
GFR, Estimated: >60 mL/min (ref >60)
Glucose, Bld: 99 mg/dL (ref 70–99)
Potassium: 3.9 mmol/L (ref 3.5–5.1)
Sodium: 133 mmol/L — ABNORMAL LOW (ref 135–145)

## 2023-04-30 LAB — URINALYSIS, ROUTINE W REFLEX MICROSCOPIC
Bilirubin Urine: NEGATIVE
Glucose, UA: NEGATIVE mg/dL
Hgb urine dipstick: NEGATIVE
Ketones, ur: NEGATIVE mg/dL
Leukocytes,Ua: NEGATIVE
Nitrite: NEGATIVE
Protein, ur: NEGATIVE mg/dL
Specific Gravity, Urine: 1.021 (ref 1.005–1.030)
pH: 6 (ref 5.0–8.0)

## 2023-04-30 LAB — CBC
HCT: 44.3 % (ref 39.0–52.0)
Hemoglobin: 14.8 g/dL (ref 13.0–17.0)
MCH: 31 pg (ref 26.0–34.0)
MCHC: 33.4 g/dL (ref 30.0–36.0)
MCV: 92.9 fL (ref 80.0–100.0)
Platelets: 230 10*3/uL (ref 150–400)
RBC: 4.77 MIL/uL (ref 4.22–5.81)
RDW: 12.1 % (ref 11.5–15.5)
WBC: 4.9 10*3/uL (ref 4.0–10.5)
nRBC: 0 % (ref 0.0–0.2)

## 2023-04-30 LAB — TROPONIN I (HIGH SENSITIVITY)
Troponin I (High Sensitivity): 5 ng/L (ref <18)
Troponin I (High Sensitivity): 6 ng/L (ref <18)

## 2023-04-30 LAB — HEMOGLOBIN A1C
Hgb A1c MFr Bld: 5.3 % (ref 4.8–5.6)
Mean Plasma Glucose: 105.41 mg/dL

## 2023-04-30 LAB — TSH: TSH: 0.398 u[IU]/mL (ref 0.350–4.500)

## 2023-04-30 LAB — CBG MONITORING, ED: Glucose-Capillary: 96 mg/dL (ref 70–99)

## 2023-04-30 LAB — HIV ANTIBODY (ROUTINE TESTING W REFLEX): HIV Screen 4th Generation wRfx: NONREACTIVE

## 2023-04-30 MED ORDER — IOHEXOL 350 MG/ML SOLN
100.0000 mL | Freq: Once | INTRAVENOUS | Status: AC | PRN
  Administered 2023-04-30: 100 mL via INTRAVENOUS

## 2023-04-30 MED ORDER — ASPIRIN 81 MG PO TBEC
81.0000 mg | DELAYED_RELEASE_TABLET | Freq: Every day | ORAL | Status: DC
  Administered 2023-05-01 – 2023-05-02 (×2): 81 mg via ORAL
  Filled 2023-04-30 (×3): qty 1

## 2023-04-30 MED ORDER — STROKE: EARLY STAGES OF RECOVERY BOOK
Freq: Once | Status: AC
  Filled 2023-04-30: qty 1

## 2023-04-30 MED ORDER — CLOPIDOGREL BISULFATE 300 MG PO TABS
300.0000 mg | ORAL_TABLET | Freq: Once | ORAL | Status: AC
  Administered 2023-04-30: 300 mg via ORAL
  Filled 2023-04-30: qty 1

## 2023-04-30 MED ORDER — ASPIRIN 325 MG PO TABS
325.0000 mg | ORAL_TABLET | Freq: Every day | ORAL | Status: DC
  Administered 2023-04-30: 325 mg via ORAL
  Filled 2023-04-30: qty 1

## 2023-04-30 MED ORDER — ENOXAPARIN SODIUM 40 MG/0.4ML IJ SOSY
40.0000 mg | PREFILLED_SYRINGE | INTRAMUSCULAR | Status: DC
  Administered 2023-04-30 – 2023-05-01 (×2): 40 mg via SUBCUTANEOUS
  Filled 2023-04-30 (×2): qty 0.4

## 2023-04-30 MED ORDER — ACETAMINOPHEN 325 MG PO TABS: 650.0000 mg | ORAL_TABLET | ORAL | Status: DC | PRN

## 2023-04-30 MED ORDER — SENNOSIDES-DOCUSATE SODIUM 8.6-50 MG PO TABS: 1.0000 | ORAL_TABLET | Freq: Every evening | ORAL | Status: DC | PRN

## 2023-04-30 MED ORDER — ACETAMINOPHEN 650 MG RE SUPP: 650.0000 mg | RECTAL | Status: DC | PRN

## 2023-04-30 MED ORDER — ATORVASTATIN CALCIUM 40 MG PO TABS
40.0000 mg | ORAL_TABLET | Freq: Every day | ORAL | Status: DC
  Administered 2023-04-30 – 2023-05-02 (×3): 40 mg via ORAL
  Filled 2023-04-30 (×3): qty 1

## 2023-04-30 MED ORDER — CLOPIDOGREL BISULFATE 75 MG PO TABS
75.0000 mg | ORAL_TABLET | Freq: Every day | ORAL | Status: DC
  Administered 2023-05-01 – 2023-05-02 (×2): 75 mg via ORAL
  Filled 2023-04-30 (×2): qty 1

## 2023-04-30 MED ORDER — ASPIRIN 300 MG RE SUPP: 300.0000 mg | Freq: Every day | RECTAL | Status: DC

## 2023-04-30 MED ORDER — ACETAMINOPHEN 160 MG/5ML PO SOLN: 650.0000 mg | ORAL | Status: DC | PRN

## 2023-04-30 MED ORDER — TIZANIDINE HCL 4 MG PO TABS: 4.0000 mg | ORAL_TABLET | Freq: Every evening | ORAL | Status: DC | PRN

## 2023-04-30 NOTE — ED Provider Notes (Signed)
  Physical Exam  BP 136/81   Pulse (!) 50   Temp 97.7 F (36.5 C)   Resp 16   Ht 5\' 4"  (1.626 m)   Wt 89.8 kg   SpO2 100%   BMI 33.99 kg/m   Physical Exam  Procedures  Procedures  ED Course / MDM   Clinical Course as of 04/30/23 1637  Mon Apr 30, 2023  1617 CBC is reviewed and interpreted and within normal limits Basic metabolic panel is reviewed interpreted significant for mild hyponatremia Urinalysis obtained shows no evidence of acute abnormality [DR]  1618 CT head obtained and reviewed and interpreted with no evidence of acute abnormality and radiologist interpretation concurs [DR]  1618 Chest x-ray reviewed interpreted and no evidence of acute cardiopulmonary process and radiologist concurs [DR]    Clinical Course User Index [DR] Margarita Grizzle, MD   Medical Decision Making Amount and/or Complexity of Data Reviewed Labs: ordered. Radiology: ordered.  Risk Decision regarding hospitalization.   62M, presenting with acute ataxia to the right, MRI pending.   MRI Brain: IMPRESSION:  1. 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  2. Mild chronic small-vessel ischemic change of the cerebral  hemispheric white matter.  3. Bilateral mastoid effusions, more extensive on the left than the  right.    Spoke with Dr. Amada Jupiter, who will see the patient in consultation. Medicine consulted for admission.  Patient was informed of his diagnosis of stroke and is amenable to admission to the hospital.      Ernie Avena, MD 04/30/23 1743

## 2023-04-30 NOTE — ED Provider Notes (Addendum)
[place] EMERGENCY DEPARTMENT AT [facility] Provider Note   CSN: [id number] Arrival date & time: 04/30/23  0805     History  Chief Complaint  Patient presents with   Near Syncope   Eye Problem    Brent Norman is a 72 y.o. male.  HPI 72 year old male history of hypertension, DVT portably not on any current anticoagulation presents today complaining of difficulties with balance.  Symptoms began yesterday.  He states he was fine when he was at an activity in [place].  It was indoors.  When he went to stand up he felt like he was going to fall to the right.  The symptoms have persisted.  He denies any weakness on the right or left side.  He states he feels like he is sleepy and feels woozy.  Denies vision changes, difficulty speaking, headache, history of stroke    Home Medications Prior to Admission medications   Medication Sig Start Date End Date Taking? Authorizing Provider  cetirizine (ZYRTEC) 10 MG tablet Take 1 tablet (10 mg total) by mouth daily. 01/09/18   Derwood Kaplan, MD  diclofenac (VOLTAREN) 75 MG EC tablet Take 75 mg by mouth 2 (two) times daily as needed for pain. 05/16/22   [provider]  hydrochlorothiazide (HYDRODIURIL) 12.5 MG tablet Take 1 tablet (12.5 mg total) by mouth daily. 05/31/18   Minna Antis, MD  loperamide (IMODIUM) 2 MG capsule Take 1 capsule (2 mg total) by mouth 4 (four) times daily as needed for diarrhea or loose stools. 02/13/23   Sabas Sous, MD  Multiple Vitamin (MULTIVITAMIN) tablet Take 1 tablet by mouth daily.    [provider]  ondansetron (ZOFRAN-ODT) 4 MG disintegrating tablet Take 1 tablet (4 mg total) by mouth every 8 (eight) hours as needed for nausea or vomiting. 02/13/23   Sabas Sous, MD  oxyCODONE (ROXICODONE) 5 MG immediate release tablet Take 1 tablet (5 mg total) by mouth every 4 (four) hours as needed for severe pain. 05/30/22   Peter Garter, PA  potassium chloride (KLOR-CON) 10 MEQ  tablet Take 10 mEq by mouth daily. 05/16/22   [provider]  tiZANidine (ZANAFLEX) 4 MG tablet Take 4 mg by mouth at bedtime as needed for muscle spasms. 05/16/22   [provider]      Allergies    Patient has no known allergies.    Review of Systems   Review of Systems  Physical Exam Updated Vital Signs BP 136/81   Pulse (!) 50   Temp 97.7 F (36.5 C)   Resp 16   Ht 1.626 m (5\' 4" )   Wt 89.8 kg   SpO2 100%   BMI 33.99 kg/m  Physical Exam Vitals and nursing note reviewed.  HENT:     Head: Normocephalic.     Right Ear: External ear normal.     Left Ear: External ear normal.     Nose: Nose normal.     Mouth/Throat:     Pharynx: Oropharynx is clear.  Eyes:     Extraocular Movements: Extraocular movements intact.     Pupils: Pupils are equal, round, and reactive to light.  Cardiovascular:     Rate and Rhythm: Normal rate and regular rhythm.     Pulses: Normal pulses.  Pulmonary:     Effort: Pulmonary effort is normal.     Breath sounds: Normal breath sounds.  Abdominal:     General: Abdomen is flat.  Musculoskeletal:  General: Normal range of motion.     Cervical back: Normal range of motion.  Skin:    General: Skin is warm.     Capillary Refill: Capillary refill takes less than 2 seconds.  Neurological:     Mental Status: He is alert.     Cranial Nerves: Cranial nerve deficit present.     Motor: Weakness present.     Comments: Patient lists to  right and is unable to stand unaided  Psychiatric:        Mood and Affect: Mood normal.        Behavior: Behavior normal.     ED Results / Procedures / Treatments   Labs (all labs ordered are listed, but only abnormal results are displayed) Labs Reviewed  BASIC METABOLIC PANEL - Abnormal; Notable for the following components:      Result Value   Sodium 133 (*)    All other components within normal limits  CBC  URINALYSIS, ROUTINE W REFLEX MICROSCOPIC  CBG MONITORING, ED  TROPONIN I  (HIGH SENSITIVITY)  TROPONIN I (HIGH SENSITIVITY)    EKG EKG Interpretation  Date/Time:  Monday April 30 2023 09:38:03 EDT Ventricular Rate:  64 PR Interval:  172 QRS Duration: 94 QT Interval:  424 QTC Calculation: 437 R Axis:   7 Text Interpretation: Normal sinus rhythm Septal infarct , age undetermined Abnormal ECG When compared with ECG of 24-Apr-2023 04:34, PREVIOUS ECG IS PRESENT Confirmed by Virgina Norfolk ([id number]) on 04/30/2023 12:57:30 PM  Radiology CT Head Wo Contrast  Result Date: 04/30/2023 CLINICAL DATA:  Dizziness, altered mental status EXAM: CT HEAD WITHOUT CONTRAST TECHNIQUE: Contiguous axial images were obtained from the base of the skull through the vertex without intravenous contrast. RADIATION DOSE REDUCTION: This exam was performed according to the departmental dose-optimization program which includes automated exposure control, adjustment of the mA and/or kV according to patient size and/or use of iterative reconstruction technique. COMPARISON:  CT head 05/30/2022 FINDINGS: Brain: There is no acute intracranial hemorrhage, extra-axial fluid collection, or acute infarct. Parenchymal volume is normal. The ventricles are normal in size. Gray-white differentiation is preserved The pituitary and suprasellar region are normal. There is no mass lesion. There is no mass effect or midline shift. Vascular: No hyperdense vessel or unexpected calcification. Skull: Normal. Negative for fracture or focal lesion. Sinuses/Orbits: The imaged paranasal sinuses are clear. Bilateral lens implants are in place. The imaged globes and orbits are otherwise unremarkable. 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 IMPRESSION: No acute intracranial pathology. Electronically Signed   By: Lesia Hausen M.D.   On: 04/30/2023 13:37   DG Chest 2 View  Result Date: 04/30/2023 CLINICAL DATA:  Syncope, unsteady gait. EXAM: CHEST - 2 VIEW COMPARISON:  Chest radiograph 04/24/2023 FINDINGS: The  cardiomediastinal silhouette is normal There is no focal consolidation or pulmonary edema. There is no pleural effusion or pneumothorax There is no acute osseous abnormality. IMPRESSION: Stable chest with no radiographic evidence of acute cardiopulmonary process. Electronically Signed   By: Lesia Hausen M.D.   On: 04/30/2023 13:29    Procedures Procedures    Medications Ordered in ED Medications - No data to display  ED Course/ Medical Decision Making/ A&P Clinical Course as of 04/30/23 1619  Mon Apr 30, 2023  1617 CBC is reviewed and interpreted and within normal limits Basic metabolic panel is reviewed interpreted significant for mild hyponatremia Urinalysis obtained shows no evidence of acute abnormality [DR]  1618 CT head obtained and reviewed  and interpreted with no evidence of acute abnormality and radiologist interpretation concurs [DR]  1618 Chest x-Cristi Gwynn reviewed interpreted and no evidence of acute cardiopulmonary process and radiologist concurs [DR]    Clinical Course User Index [DR] Margarita Grizzle, MD                             Medical Decision Making Amount and/or Complexity of Data Reviewed Labs: ordered. Radiology: ordered.   72 year old gentleman presents today complaining of listing to the right and difficulty standing.  Symptoms began yesterday and is not within code stroke window. Patient was evaluated here with labs and imaging.  CT head shows no evidence of acute abnormality Differential diagnosis includes but is not limited to stroke, intracerebral hemorrhage, acute electrolyte abnormality, hypoglycemia, seizure Electrolytes reviewed interpreted normal Blood sugar has remained normal here CT shows no evidence of bleeding MRI is pending Care discussed with Dr. Karene Fry who will assume care.       Final Clinical Impression(s) / ED Diagnoses Final diagnoses:  Weakness    Rx / DC Orders ED Discharge Orders     None         Margarita Grizzle,  MD 04/30/23 1610    Margarita Grizzle, MD 04/30/23 1640

## 2023-04-30 NOTE — ED Provider Triage Note (Signed)
Emergency Medicine Provider Triage Evaluation Note  Brent Norman , a 72 y.o. male  was evaluated in triage.  Pt complains of lightheadedness began yesterday.  Review of Systems  Positive: Mild dyspnea,   Negative: Po ok, chest pain  Physical Exam  BP 106/67 (BP Location: Right Arm)   Pulse (!) 58   Temp 98 F (36.7 C)   Resp 18   Ht 1.626 m (5\' 4" )   Wt 89.8 kg   SpO2 97%   BMI 33.99 kg/m  Gen:   Awake, no distress   Resp:  Normal effort  MSK:   Moves extremities without difficulty  Other:    Medical Decision Making  Medically screening exam initiated at 9:35 AM.  Appropriate orders placed.  Brent Norman was informed that the remainder of the evaluation will be completed by another provider, this initial triage assessment does not replace that evaluation, and the importance of remaining in the ED until their evaluation is complete.  EKG with nsst changes v2-v4 with some elevation does not meet stemi criteria Patient denies chest pain but had some pain last Sunday (8days ago) Will add labs including Brent Livings, MD 04/30/23 [phone]

## 2023-04-30 NOTE — H&P (Signed)
History and Physical    Patient: Brent Norman MVH:[id number] DOB: 19-Feb-1951 DOA: 04/30/2023 DOS: the patient was seen and examined on 04/30/2023 PCP: Fleet Contras, MD  Patient coming from: Home - lives with family (wife has dementia, children are "crackheads"); NOK:  Wife, Jeramyah Goodpasture, [phone]   Chief Complaint: Ataxia  HPI: Brent Norman is a 72 y.o. male with medical history significant of HTN and chronic back pain presenting with ataxia.  He reports that he got up to walk about 1230-1300 yesterday and had to hold onto the refrigerator to walk.  This continued through the day but was worse today, and he was running into the wall while trying to walk.  He has a congenital RLE-associated ambulatory dysfunction, but this was clearly different from his baseline.  No other significant symptoms.    ER Course:  CVA, symptoms around noon yesterday.  Neurology is consulting.     Review of Systems: As mentioned in the history of present illness. All other systems reviewed and are negative. Past Medical History:  Diagnosis Date   Arthritis    Chronic back pain    DVT (deep venous thrombosis) (HCC)    HTN (hypertension)    Past Surgical History:  Procedure Laterality Date   APPENDECTOMY     Social History:  reports that he has never smoked. He has never used smokeless tobacco. He reports that he does not drink alcohol and does not use drugs.  No Known Allergies  No family history on file.  Prior to Admission medications   Medication Sig Start Date End Date Taking? Authorizing Provider  cetirizine (ZYRTEC) 10 MG tablet Take 1 tablet (10 mg total) by mouth daily. 01/09/18   Derwood Kaplan, MD  diclofenac (VOLTAREN) 75 MG EC tablet Take 75 mg by mouth 2 (two) times daily as needed for pain. 05/16/22   [provider]  hydrochlorothiazide (HYDRODIURIL) 12.5 MG tablet Take 1 tablet (12.5 mg total) by mouth daily. 05/31/18   Minna Antis, MD  loperamide (IMODIUM) 2  MG capsule Take 1 capsule (2 mg total) by mouth 4 (four) times daily as needed for diarrhea or loose stools. 02/13/23   Sabas Sous, MD  Multiple Vitamin (MULTIVITAMIN) tablet Take 1 tablet by mouth daily.    [provider]  ondansetron (ZOFRAN-ODT) 4 MG disintegrating tablet Take 1 tablet (4 mg total) by mouth every 8 (eight) hours as needed for nausea or vomiting. 02/13/23   Sabas Sous, MD  oxyCODONE (ROXICODONE) 5 MG immediate release tablet Take 1 tablet (5 mg total) by mouth every 4 (four) hours as needed for severe pain. 05/30/22   Peter Garter, PA  potassium chloride (KLOR-CON) 10 MEQ tablet Take 10 mEq by mouth daily. 05/16/22   [provider]  tiZANidine (ZANAFLEX) 4 MG tablet Take 4 mg by mouth at bedtime as needed for muscle spasms. 05/16/22   [provider]    Physical Exam: Vitals:   04/30/23 0848 04/30/23 0929 04/30/23 1245 04/30/23 1728  BP: 106/67  136/81 134/79  Pulse: (!) 58  (!) 50 (!) 53  Resp: 18  16 16   Temp: 98 F (36.7 C)  97.7 F (36.5 C) 98.3 F (36.8 C)  TempSrc:    Oral  SpO2: 97%  100% 97%  Weight:  89.8 kg    Height:  5\' 4"  (1.626 m)     General:  Appears calm and comfortable and is in NAD Eyes:  EOMI, normal lids, iris ENT:  grossly normal hearing, lips & tongue, mmm; edentulous Neck:  no LAD, masses or thyromegaly Cardiovascular:  RRR, no m/r/g. No LE edema.  Respiratory:   CTA bilaterally with no wheezes/rales/rhonchi.  Normal respiratory effort. Abdomen:  soft, NT, ND Skin:  no rash or induration seen on limited exam Musculoskeletal:  grossly normal tone BUE/BLE, good ROM, no bony abnormality Psychiatric:  grossly normal mood and affect, speech fluent and appropriate, AOx3 Neurologic:  CN 2-12 grossly intact, moves all extremities in coordinated fashion   Radiological Exams on Admission: Independently reviewed - see discussion in A/P where applicable  MR BRAIN WO CONTRAST  Result Date: 04/30/2023 CLINICAL  DATA:  Neuro deficit, acute, stroke suspected. Acute balance disturbance. EXAM: MRI HEAD WITHOUT CONTRAST TECHNIQUE: Multiplanar, multiecho pulse sequences of the brain and surrounding structures were obtained without intravenous contrast. COMPARISON:  Head CT same day FINDINGS: Brain: Diffusion imaging shows an acute infarction at the right posterolateral medulla. No other acute insult. Otherwise, there is no visible abnormality affecting the brainstem or cerebellum. Cerebral hemispheres show mild chronic small-vessel change of the white matter but no large vessel territory stroke. No mass, hemorrhage, hydrocephalus or extra-axial collection. Vascular: Major vessels at the base of the brain show flow. Skull and upper cervical spine: Negative Sinuses/Orbits: Paranasal sinuses are clear. There are bilateral mastoid effusions, more extensive on the left than the right. Orbits negative. Other: None IMPRESSION: 1. 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 2. Mild chronic small-vessel ischemic change of the cerebral hemispheric white matter. 3. Bilateral mastoid effusions, more extensive on the left than the right. Electronically Signed   By: Paulina Fusi M.D.   On: 04/30/2023 17:12   CT Head Wo Contrast  Result Date: 04/30/2023 CLINICAL DATA:  Dizziness, altered mental status EXAM: CT HEAD WITHOUT CONTRAST TECHNIQUE: Contiguous axial images were obtained from the base of the skull through the vertex without intravenous contrast. RADIATION DOSE REDUCTION: This exam was performed according to the departmental dose-optimization program which includes automated exposure control, adjustment of the mA and/or kV according to patient size and/or use of iterative reconstruction technique. COMPARISON:  CT head 05/30/2022 FINDINGS: Brain: There is no acute intracranial hemorrhage, extra-axial fluid collection, or acute infarct. Parenchymal volume is normal. The ventricles are normal in size. Gray-white  differentiation is preserved The pituitary and suprasellar region are normal. There is no mass lesion. There is no mass effect or midline shift. Vascular: No hyperdense vessel or unexpected calcification. Skull: Normal. Negative for fracture or focal lesion. Sinuses/Orbits: The imaged paranasal sinuses are clear. Bilateral lens implants are in place. The imaged globes and orbits are otherwise unremarkable. 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 IMPRESSION: No acute intracranial pathology. Electronically Signed   By: Lesia Hausen M.D.   On: 04/30/2023 13:37   DG Chest 2 View  Result Date: 04/30/2023 CLINICAL DATA:  Syncope, unsteady gait. EXAM: CHEST - 2 VIEW COMPARISON:  Chest radiograph 04/24/2023 FINDINGS: The cardiomediastinal silhouette is normal There is no focal consolidation or pulmonary edema. There is no pleural effusion or pneumothorax There is no acute osseous abnormality. IMPRESSION: Stable chest with no radiographic evidence of acute cardiopulmonary process. Electronically Signed   By: Lesia Hausen M.D.   On: 04/30/2023 13:29    EKG: Independently reviewed.  NSR with rate 64; nonspecific ST changes with no evidence of acute ischemia   Labs on Admission: I have personally reviewed the available labs and imaging studies at the time of the admission.  Pertinent labs:    Na++ 133 HS troponin 5, 6 Normal CBC Normal UA   Assessment and Plan: Principal Problem:   Acute CVA (cerebrovascular accident) (HCC) Active Problems:   Essential hypertension   Class 1 obesity due to excess calories with body mass index (BMI) of 33.0 to 33.9 in adult    Acute CVA -Patient with acute ataxia, concerning for CVA -MRI confirms medullary CVA -Aspirin has been given to reduce stroke mortality and decrease morbidity -Will admit for further CVA evaluation -Telemetry monitoring -Likely needs CTA vs. MRA - will defer to neurology -Echo ordered -Risk stratification with FLP,  A1c; will also check TSH and UDS -Patient will need DAPT for 21 days when ABCD2 score is at least 4 and NIH score is 3 or less, and then can transition to monotherapy with a single antiplatelet agent.  Will defer to neurology for now. -Neurology consult -PT/OT/ST/Nutrition Consults  HTN -Allow permissive HTN for now -Treat BP only if >220/120, and then with goal of 15% reduction -Hold hydrochlorothiazide and plan to restart in 48-72 hours   HLD -Check FLP -Will start empiric Lipitor 40 mg daily   Chronic back pain -I have reviewed this patient in the [place] Controlled Substances Reporting System.  He is not receiving chronic controlled medications.  -He is not at particularly high risk of opioid misuse, diversion, or overdose.   Obesity -Body mass index is 33.99 kg/m..  -Weight loss should be encouraged -Outpatient PCP/bariatric medicine/bariatric surgery f/u encouraged        Advance Care Planning:   Code Status: Full Code - Code status was discussed with the patient family at the time of admission.  The patient would want to receive full resuscitative measures at this time.   Consults: Neurology; PT/OT/ST; nutrition; TOC team  DVT Prophylaxis: Lovenox  Family Communication: None present; he was on the phone with family at the start of the evaluation  Severity of Illness: The appropriate patient status for this patient is INPATIENT. Inpatient status is judged to be reasonable and necessary in order to provide the required intensity of service to ensure the patient's safety. The patient's presenting symptoms, physical exam findings, and initial radiographic and laboratory data in the context of their chronic comorbidities is felt to place them at high risk for further clinical deterioration. Furthermore, it is not anticipated that the patient will be medically stable for discharge from the hospital within 2 midnights of admission.   * I certify that at the point of admission it is  my clinical judgment that the patient will require inpatient hospital care spanning beyond 2 midnights from the point of admission due to high intensity of service, high risk for further deterioration and high frequency of surveillance required.*  Author: Jonah Blue, MD 04/30/2023 6:25 PM  For on call review [email or web address].

## 2023-04-30 NOTE — ED Triage Notes (Signed)
Pt. Stated, My eyes feel like Im sleepy but Im not and when I get up I feel whoozy. This started yesterday around 130pm

## 2023-04-30 NOTE — ED Notes (Signed)
Patient transported to MRI 

## 2023-04-30 NOTE — Consult Note (Signed)
Neurology Consultation Reason for Consult: Stroke Referring Physician: Hilbert Corrigan  CC: Unsteadiness  History is obtained from: Patient  HPI: Brent Norman is a 72 y.o. male who was in his normal state of health until yesterday afternoon at which point he noticed that he was staggering.  He went home and went to sleep, hoping it be better in the morning, but this morning he had to hold onto things to walk and therefore presented to the emergency department.  He denies any diplopia, numbness, weakness, just notes that he is unsteady.   LKW: 1:30 PM 6/23 tnk given?: no, outside the window Premorbid modified rankin scale: Zero  Past Medical History:  Diagnosis Date   Arthritis    Chronic back pain    DVT (deep venous thrombosis) (HCC)    HTN (hypertension)      No family history on file.   Social History:  reports that he has never smoked. He has never used smokeless tobacco. He reports that he does not drink alcohol and does not use drugs.   Exam: Current vital signs: BP 134/79 (BP Location: Right Arm)   Pulse (!) 53   Temp 98.3 F (36.8 C) (Oral)   Resp 16   Ht 5\' 4"  (1.626 m)   Wt 89.8 kg   SpO2 97%   BMI 33.99 kg/m  Vital signs in last 24 hours: Temp:  [97.7 F (36.5 C)-98.3 F (36.8 C)] 98.3 F (36.8 C) (06/24 1728) Pulse Rate:  [50-58] 53 (06/24 1728) Resp:  [16-18] 16 (06/24 1728) BP: (106-136)/(67-81) 134/79 (06/24 1728) SpO2:  [97 %-100 %] 97 % (06/24 1728) Weight:  [89.8 kg] 89.8 kg (06/24 0929)   Physical Exam  Appears well-developed and well-nourished.   Neuro: Mental Status: Patient is awake, alert, oriented to person, place, month, year, and situation. Patient is able to give a clear and coherent history. No signs of aphasia or neglect Cranial Nerves: II: Visual Fields are full. Pupils are equal, round, and reactive to light.   III,IV, VI: EOMI without ptosis or diploplia.  V: Facial sensation is symmetric to temperature VII: Facial  movement with mild right facial weakness VIII: hearing is intact to voice X: Uvula elevates symmetrically XI: Shoulder shrug is symmetric. XII: tongue is midline without atrophy or fasciculations.  Motor: Tone is normal. Bulk is normal. 5/5 strength was present in all four extremities.  Sensory: Sensation is symmetric to light touch and temperature in the arms and legs. Cerebellar: FNF and HKS are both ataxic on the right  I have reviewed labs in epic and the results pertinent to this consultation are: Creatinine 1.13  I have reviewed the images obtained: MRI brain-punctate stroke at the junction of the medulla and cerebellum on the right  Impression: 72 year old male with small acute infarct which I suspect is likely secondary to small vessel disease.  He was not on any antiplatelet therapy prior to admission, I would favor dual antiplatelet therapy for 3 weeks.  He will need admission both for physical therapy as well as secondary risk factor modification.  Recommendations: - HgbA1c, fasting lipid panel - MRI of the brain without contrast - Frequent neuro checks - Echocardiogram - CTA head and neck - Prophylactic therapy-Antiplatelet med: Aspirin - dose 81mg  and plavix 75mg  daily  after 300mg  load  - Risk factor modification - Telemetry monitoring - PT consult, OT consult, Speech consult - Stroke team to follow  Ritta Slot, MD [facility] [phone]  If 7pm- 7am, please  page neurology on call as listed in AMION.

## 2023-05-01 ENCOUNTER — Inpatient Hospital Stay (HOSPITAL_COMMUNITY): Payer: No Typology Code available for payment source

## 2023-05-01 ENCOUNTER — Encounter (HOSPITAL_COMMUNITY): Payer: Self-pay | Admitting: Internal Medicine

## 2023-05-01 DIAGNOSIS — R531 Weakness: Secondary | ICD-10-CM | POA: Diagnosis not present

## 2023-05-01 DIAGNOSIS — I10 Essential (primary) hypertension: Secondary | ICD-10-CM

## 2023-05-01 DIAGNOSIS — E6609 Other obesity due to excess calories: Secondary | ICD-10-CM

## 2023-05-01 DIAGNOSIS — I6389 Other cerebral infarction: Secondary | ICD-10-CM | POA: Diagnosis not present

## 2023-05-01 DIAGNOSIS — Z6833 Body mass index (BMI) 33.0-33.9, adult: Secondary | ICD-10-CM

## 2023-05-01 DIAGNOSIS — E7849 Other hyperlipidemia: Secondary | ICD-10-CM

## 2023-05-01 DIAGNOSIS — I639 Cerebral infarction, unspecified: Secondary | ICD-10-CM | POA: Diagnosis not present

## 2023-05-01 LAB — ECHOCARDIOGRAM COMPLETE
AR max vel: 2.77 cm2
AV Area VTI: 2.14 cm2
AV Area mean vel: 2.48 cm2
AV Mean grad: 3 mmHg
AV Peak grad: 5.1 mmHg
Ao pk vel: 1.13 m/s
Area-P 1/2: 1.85 cm2
Height: 64 in
S' Lateral: 2.6 cm
Weight: 3168 oz

## 2023-05-01 LAB — LIPID PANEL
Cholesterol: 209 mg/dL — ABNORMAL HIGH (ref 0–200)
HDL: 38 mg/dL — ABNORMAL LOW (ref >40)
LDL Cholesterol: 146 mg/dL — ABNORMAL HIGH (ref 0–99)
Total CHOL/HDL Ratio: 5.5 RATIO
Triglycerides: 124 mg/dL (ref <150)
VLDL: 25 mg/dL (ref 0–40)

## 2023-05-01 LAB — RAPID URINE DRUG SCREEN, HOSP PERFORMED
Amphetamines: NOT DETECTED
Barbiturates: NOT DETECTED
Benzodiazepines: NOT DETECTED
Cocaine: NOT DETECTED
Opiates: NOT DETECTED
Tetrahydrocannabinol: NOT DETECTED

## 2023-05-01 NOTE — ED Notes (Signed)
ED TO INPATIENT HANDOFF REPORT  ED Nurse Name and Phone #: Fara Worthy [phone]  S Name/Age/Gender Brent Norman 72 y.o. male Room/Bed: 041C/041C  Code Status   Code Status: Full Code  Home/SNF/Other Home Patient oriented to: self, place, time, and situation Is this baseline? Yes   Triage Complete: Triage complete  Chief Complaint Acute CVA (cerebrovascular accident) [facility]) [I63.9]  Triage Note Pt. Stated, My eyes feel like Im sleepy but Im not and when I get up I feel whoozy. This started yesterday around 130pm   Allergies No Known Allergies  Level of Care/Admitting Diagnosis ED Disposition     ED Disposition  Admit   Condition  --   Comment  Hospital Area: [facility] [[id number]]  Level of Care: Telemetry Medical [104]  May admit patient to [facility] or [facility] if equivalent level of care is available:: No  Covid Evaluation: Asymptomatic - no recent exposure (last 10 days) testing not required  Diagnosis: Acute CVA (cerebrovascular accident) [facility]) [[id number]]  Admitting Physician: Jonah Blue [[id number]]  Attending Physician: Jonah Blue [[id number]]  Certification:: I certify this patient will need inpatient services for at least 2 midnights  Estimated Length of Stay: 3          B Medical/Surgery History Past Medical History:  Diagnosis Date   Arthritis    Chronic back pain    DVT (deep venous thrombosis) (HCC)    HTN (hypertension)    Past Surgical History:  Procedure Laterality Date   APPENDECTOMY       A IV Location/Drains/Wounds Patient Lines/Drains/Airways Status     Active Line/Drains/Airways     Name Placement date Placement time Site Days   Peripheral IV 04/30/23 20 G 1" Anterior;Distal;Left Forearm 04/30/23  2013  Forearm  1            Intake/Output Last 24 hours No intake or output data in the 24 hours ending 05/01/23 1711  Labs/Imaging Results for orders placed or performed during the hospital encounter of 04/30/23  (from the past 48 hour(s))  Basic metabolic panel     Status: Abnormal   Collection Time: 04/30/23  9:32 AM  Result Value Ref Range   Sodium 133 (L) 135 - 145 mmol/L   Potassium 3.9 3.5 - 5.1 mmol/L   Chloride 102 98 - 111 mmol/L   CO2 22 22 - 32 mmol/L   Glucose, Bld 99 70 - 99 mg/dL    Comment: Glucose reference range applies only to samples taken after fasting for at least 8 hours.   BUN 14 8 - 23 mg/dL   Creatinine, Ser 4.78 0.61 - 1.24 mg/dL   Calcium 9.3 8.9 - 29.5 mg/dL   GFR, Estimated >62 >13 mL/min    Comment: (NOTE) Calculated using the CKD-EPI Creatinine Equation (2021)    Anion gap 9 5 - 15    Comment: Performed at [facility], [address], [place], [place] [address]  CBC     Status: None   Collection Time: 04/30/23  9:32 AM  Result Value Ref Range   WBC 4.9 4.0 - 10.5 K/uL   RBC 4.77 4.22 - 5.81 MIL/uL   Hemoglobin 14.8 13.0 - 17.0 g/dL   HCT 84.6 96.2 - 95.2 %   MCV 92.9 80.0 - 100.0 fL   MCH 31.0 26.0 - 34.0 pg   MCHC 33.4 30.0 - 36.0 g/dL   RDW 84.1 32.4 - 40.1 %   Platelets 230 150 - 400  K/uL   nRBC 0.0 0.0 - 0.2 %    Comment: Performed at [facility], [address] [place], [place] [address]  Troponin I (High Sensitivity)     Status: None   Collection Time: 04/30/23  9:32 AM  Result Value Ref Range   Troponin I (High Sensitivity) 5 <18 ng/L    Comment: (NOTE) Elevated high sensitivity troponin I (hsTnI) values and significant  changes across serial measurements may suggest ACS but many other  chronic and acute conditions are known to elevate hsTnI results.  Refer to the "Links" section for chest pain algorithms and additional  guidance. Performed at [facility], [address] [place], [place] [address]   Hemoglobin A1c     Status: None   Collection Time: 04/30/23  9:37 AM  Result Value Ref Range   Hgb A1c MFr Bld 5.3 4.8 - 5.6 %    Comment: (NOTE) Pre diabetes:          5.7%-6.4%  Diabetes:               >6.4%  Glycemic control for   <7.0% adults with diabetes    Mean Plasma Glucose 105.41 mg/dL    Comment: Performed at [facility], [address], [place], [place] [address]  Urinalysis, Routine w reflex microscopic -Urine, Random     Status: None   Collection Time: 04/30/23 10:23 AM  Result Value Ref Range   Color, Urine YELLOW YELLOW   APPearance CLEAR CLEAR   Specific Gravity, Urine 1.021 1.005 - 1.030   pH 6.0 5.0 - 8.0   Glucose, UA NEGATIVE NEGATIVE mg/dL   Hgb urine dipstick NEGATIVE NEGATIVE   Bilirubin Urine NEGATIVE NEGATIVE   Ketones, ur NEGATIVE NEGATIVE mg/dL   Protein, ur NEGATIVE NEGATIVE mg/dL   Nitrite NEGATIVE NEGATIVE   Leukocytes,Ua NEGATIVE NEGATIVE    Comment: Performed at [facility], [address], [place], [place] [address]  CBG monitoring, ED     Status: None   Collection Time: 04/30/23 12:42 PM  Result Value Ref Range   Glucose-Capillary 96 70 - 99 mg/dL    Comment: Glucose reference range applies only to samples taken after fasting for at least 8 hours.   Comment 1 Notify RN    Comment 2 Document in Chart   Troponin I (High Sensitivity)     Status: None   Collection Time: 04/30/23  1:44 PM  Result Value Ref Range   Troponin I (High Sensitivity) 6 <18 ng/L    Comment: (NOTE) Elevated high sensitivity troponin I (hsTnI) values and significant  changes across serial measurements may suggest ACS but many other  chronic and acute conditions are known to elevate hsTnI results.  Refer to the "Links" section for chest pain algorithms and additional  guidance. Performed at [facility], [address] [place], [place] [address]   Urine rapid drug screen (hosp performed)not at [facility]     Status: None   Collection Time: 04/30/23  6:10 PM  Result Value Ref Range   Opiates NONE DETECTED NONE DETECTED   Cocaine NONE DETECTED NONE DETECTED   Benzodiazepines NONE DETECTED NONE DETECTED   Amphetamines NONE DETECTED NONE DETECTED    Tetrahydrocannabinol NONE DETECTED NONE DETECTED   Barbiturates NONE DETECTED NONE DETECTED    Comment: (NOTE) DRUG SCREEN FOR MEDICAL PURPOSES ONLY.  IF CONFIRMATION IS NEEDED FOR ANY PURPOSE, NOTIFY LAB WITHIN 5 DAYS.  LOWEST DETECTABLE LIMITS FOR  URINE DRUG SCREEN Drug Class                     Cutoff (ng/mL) Amphetamine and metabolites    1000 Barbiturate and metabolites    200 Benzodiazepine                 200 Opiates and metabolites        300 Cocaine and metabolites        300 THC                            50 Performed at [facility], [address] [place], [place] [address]   HIV Antibody (routine testing w rflx)     Status: None   Collection Time: 04/30/23  7:37 PM  Result Value Ref Range   HIV Screen 4th Generation wRfx Non Reactive Non Reactive    Comment: Performed at [facility], [address] [place], [place] [address]  TSH     Status: None   Collection Time: 04/30/23  7:37 PM  Result Value Ref Range   TSH 0.398 0.350 - 4.500 uIU/mL    Comment: Performed by a 3rd Generation assay with a functional sensitivity of <=0.01 uIU/mL. Performed at [facility], [address], [place], [place] [address]   Lipid panel     Status: Abnormal   Collection Time: 05/01/23  2:39 AM  Result Value Ref Range   Cholesterol 209 (H) 0 - 200 mg/dL   Triglycerides 132 <440 mg/dL   HDL 38 (L) >10 mg/dL   Total CHOL/HDL Ratio 5.5 RATIO   VLDL 25 0 - 40 mg/dL   LDL Cholesterol 272 (H) 0 - 99 mg/dL    Comment:        Total Cholesterol/HDL:CHD Risk Coronary Heart Disease Risk Table                     Men   Women  1/2 Average Risk   3.4   3.3  Average Risk       5.0   4.4  2 X Average Risk   9.6   7.1  3 X Average Risk  23.4   11.0        Use the calculated Patient Ratio above and the CHD Risk Table to determine the patient's CHD Risk.        ATP III CLASSIFICATION (LDL):  <100     mg/dL   Optimal  536-644  mg/dL   Near or Above                     Optimal  130-159  mg/dL   Borderline  034-742  mg/dL   High  >595     mg/dL   Very High Performed at [facility], [address], [place], [place] [address]    ECHOCARDIOGRAM COMPLETE  Result Date: 05/01/2023    ECHOCARDIOGRAM REPORT   Patient Name:   Brent Norman Date of Exam: 05/01/2023 Medical Rec #:  [id number]      Height:       64.0 in Accession #:    [id number]     Weight:       198.0 lb Date of Birth:  1951-08-13      BSA:          1.948 m Patient Age:    9  years       BP:           134/81 mmHg Patient Gender: M              HR:           58 bpm. Exam Location:  Inpatient Procedure: 2D Echo, Cardiac Doppler and Color Doppler Indications:    Stroke  History:        Patient has no prior history of Echocardiogram examinations.                 TIA; Risk Factors:Hypertension.  Sonographer:    Darlys Gales Referring Phys: [id number] JENNIFER YATES IMPRESSIONS  1. Left ventricular ejection fraction, by estimation, is 60 to 65%. The left ventricle has normal function. The left ventricle has no regional wall motion abnormalities. Left ventricular diastolic parameters are consistent with Grade I diastolic dysfunction (impaired relaxation).  2. Right ventricular systolic function is normal. The right ventricular size is normal.  3. The mitral valve is normal in structure. Mild mitral valve regurgitation. No evidence of mitral stenosis.  4. The aortic valve is normal in structure. Aortic valve regurgitation is not visualized. No aortic stenosis is present.  5. The inferior vena cava is normal in size with greater than 50% respiratory variability, suggesting right atrial pressure of 3 mmHg. FINDINGS  Left Ventricle: Left ventricular ejection fraction, by estimation, is 60 to 65%. The left ventricle has normal function. The left ventricle has no regional wall motion abnormalities. The left ventricular internal cavity size was normal in size. There is  no left ventricular hypertrophy. Left ventricular diastolic  parameters are consistent with Grade I diastolic dysfunction (impaired relaxation). Right Ventricle: The right ventricular size is normal. No increase in right ventricular wall thickness. Right ventricular systolic function is normal. Left Atrium: Left atrial size was normal in size. Right Atrium: Right atrial size was normal in size. Pericardium: There is no evidence of pericardial effusion. Presence of epicardial fat layer. Mitral Valve: The mitral valve is normal in structure. Mild mitral valve regurgitation. No evidence of mitral valve stenosis. Tricuspid Valve: The tricuspid valve is normal in structure. Tricuspid valve regurgitation is not demonstrated. No evidence of tricuspid stenosis. Aortic Valve: The aortic valve is normal in structure. Aortic valve regurgitation is not visualized. No aortic stenosis is present. Aortic valve mean gradient measures 3.0 mmHg. Aortic valve peak gradient measures 5.1 mmHg. Aortic valve area, by VTI measures 2.14 cm. Pulmonic Valve: The pulmonic valve was normal in structure. Pulmonic valve regurgitation is not visualized. No evidence of pulmonic stenosis. Aorta: The aortic root is normal in size and structure. Venous: The inferior vena cava is normal in size with greater than 50% respiratory variability, suggesting right atrial pressure of 3 mmHg. IAS/Shunts: No atrial level shunt detected by color flow Doppler.  LEFT VENTRICLE PLAX 2D LVIDd:         4.90 cm   Diastology LVIDs:         2.60 cm   LV e' medial:    4.46 cm/s LV PW:         0.90 cm   LV E/e' medial:  14.6 LV IVS:        1.00 cm   LV e' lateral:   5.55 cm/s LVOT diam:     2.00 cm   LV E/e' lateral: 11.7 LV SV:         53 LV SV Index:   27 LVOT Area:  3.14 cm  RIGHT VENTRICLE RV S prime:     15.30 cm/s TAPSE (M-mode): 3.2 cm LEFT ATRIUM             Index        RIGHT ATRIUM           Index LA Vol (A2C):   30.4 ml 15.61 ml/m  RA Area:     10.20 cm LA Vol (A4C):   25.3 ml 12.99 ml/m  RA Volume:   21.80 ml   11.19 ml/m LA Biplane Vol: 29.8 ml 15.30 ml/m  AORTIC VALVE AV Area (Vmax):    2.77 cm AV Area (Vmean):   2.48 cm AV Area (VTI):     2.14 cm AV Vmax:           113.00 cm/s AV Vmean:          79.700 cm/s AV VTI:            0.249 m AV Peak Grad:      5.1 mmHg AV Mean Grad:      3.0 mmHg LVOT Vmax:         99.70 cm/s LVOT Vmean:        63.000 cm/s LVOT VTI:          0.170 m LVOT/AV VTI ratio: 0.68  AORTA Ao Root diam: 3.00 cm MITRAL VALVE MV Area (PHT): 1.85 cm    SHUNTS MV Decel Time: 410 msec    Systemic VTI:  0.17 m MV E velocity: 64.90 cm/s  Systemic Diam: 2.00 cm MV A velocity: 88.10 cm/s MV E/A ratio:  0.74 Kardie Tobb DO Electronically signed by Thomasene Ripple DO Signature Date/Time: 05/01/2023/4:21:47 PM    Final    CT ANGIO HEAD NECK W WO CM  Result Date: 04/30/2023 CLINICAL DATA:  Follow-up examination for stroke. EXAM: CT ANGIOGRAPHY HEAD AND NECK WITH AND WITHOUT CONTRAST TECHNIQUE: Multidetector CT imaging of the head and neck was performed using the standard protocol during bolus administration of intravenous contrast. Multiplanar CT image reconstructions and MIPs were obtained to evaluate the vascular anatomy. Carotid stenosis measurements (when applicable) are obtained utilizing NASCET criteria, using the distal internal carotid diameter as the denominator. RADIATION DOSE REDUCTION: This exam was performed according to the departmental dose-optimization program which includes automated exposure control, adjustment of the mA and/or kV according to patient size and/or use of iterative reconstruction technique. CONTRAST:  OMNIPAQUE IOHEXOL 350 MG/ML SOLN COMPARISON:  Prior studies from earlier the same day. FINDINGS: CTA NECK FINDINGS Aortic arch: Visualized aortic arch normal in caliber with standard branch pattern. No stenosis about the origin of the great vessels. Right carotid system: Right common and internal carotid arteries are patent without dissection. Atheromatous change about the  right carotid bulb with associated stenosis of up to approximately 50% by NASCET criteria. Left carotid system: Left common and internal carotid arteries are patent without evidence for dissection. No hemodynamically significant stenosis about the left carotid artery system. Vertebral arteries: Both vertebral arteries arise from subclavian arteries. No proximal subclavian artery stenosis. Left vertebral artery patent without stenosis or dissection. 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 Skeleton: No discrete or worrisome osseous lesions. 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 Other neck: No other acute abnormality within the neck. Upper chest: Visualized upper chest demonstrates no acute finding. Review of the MIP images confirms the above findings CTA HEAD FINDINGS Anterior circulation: She  both internal carotid arteries are patent to the termini without stenosis or other abnormality. A1 segments, anterior communicating complex common anterior cerebral arteries patent without stenosis. No M1 stenosis or occlusion. No proximal MCA branch occlusion or high-grade stenosis. Distal MCA branches perfused and symmetric. Posterior circulation: Left V4 segment widely patent. Left PICA patent. Retrograde filling of the right V4 segment which is widely patent without stenosis. Distal right V4 segment is fenestrated/duplicated. Right PICA patent. Basilar diminutive but patent without stenosis. Superior cerebral arteries patent bilaterally. Both PCA supplied via hypoplastic P1 segments and robust bilateral posterior communicating arteries. Both PCAs patent without stenosis. Venous sinuses: Grossly patent allowing for timing the contrast bolus. Anatomic variants: As above. Review of the MIP images confirms the above findings IMPRESSION: 1. Negative CTA for acute large vessel occlusion or other emergent finding. 2. 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 Retrograde filling of the right V4 segment which is widely patent without stenosis. 3. 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 4. No other hemodynamically significant or correctable stenosis about the major arterial vasculature of the head and neck. Electronically Signed   By: Rise Mu M.D.   On: 04/30/2023 21:48   MR BRAIN WO CONTRAST  Result Date: 04/30/2023 CLINICAL DATA:  Neuro deficit, acute, stroke suspected. Acute balance disturbance. EXAM: MRI HEAD WITHOUT CONTRAST TECHNIQUE: Multiplanar, multiecho pulse sequences of the brain and surrounding structures were obtained without intravenous contrast. COMPARISON:  Head CT same day FINDINGS: Brain: Diffusion imaging shows an acute infarction at the right posterolateral medulla. No other acute insult. Otherwise, there is no visible abnormality affecting the brainstem or cerebellum. Cerebral hemispheres show mild chronic small-vessel change of the white matter but no large vessel territory stroke. No mass, hemorrhage, hydrocephalus or extra-axial collection. Vascular: Major vessels at the base of the brain show flow. Skull and upper cervical spine: Negative Sinuses/Orbits: Paranasal sinuses are clear. There are bilateral mastoid effusions, more extensive on the left than the right. Orbits negative. Other: None IMPRESSION: 1. 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 2. Mild chronic small-vessel ischemic change of the cerebral hemispheric white matter. 3. Bilateral mastoid effusions, more extensive on the left than the right. Electronically Signed   By: Paulina Fusi M.D.   On: 04/30/2023 17:12   CT Head Wo Contrast  Result Date: 04/30/2023 CLINICAL DATA:  Dizziness, altered mental status EXAM: CT HEAD WITHOUT CONTRAST TECHNIQUE: Contiguous axial images were obtained from the base  of the skull through the vertex without intravenous contrast. RADIATION DOSE REDUCTION: This exam was performed according to the departmental dose-optimization program which includes automated exposure control, adjustment of the mA and/or kV according to patient size and/or use of iterative reconstruction technique. COMPARISON:  CT head 05/30/2022 FINDINGS: Brain: There is no acute intracranial hemorrhage, extra-axial fluid collection, or acute infarct. Parenchymal volume is normal. The ventricles are normal in size. Gray-white differentiation is preserved The pituitary and suprasellar region are normal. There is no mass lesion. There is no mass effect or midline shift. Vascular: No hyperdense vessel or unexpected calcification. Skull: Normal. Negative for fracture or focal lesion. Sinuses/Orbits: The imaged paranasal sinuses are clear. Bilateral lens implants are in place. The imaged globes and orbits are otherwise unremarkable. 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 IMPRESSION: No acute intracranial pathology. Electronically Signed   By: Selena Lesser.D.  On: 04/30/2023 13:37   DG Chest 2 View  Result Date: 04/30/2023 CLINICAL DATA:  Syncope, unsteady gait. EXAM: CHEST - 2 VIEW COMPARISON:  Chest radiograph 04/24/2023 FINDINGS: The cardiomediastinal silhouette is normal There is no focal consolidation or pulmonary edema. There is no pleural effusion or pneumothorax There is no acute osseous abnormality. IMPRESSION: Stable chest with no radiographic evidence of acute cardiopulmonary process. Electronically Signed   By: Lesia Hausen M.D.   On: 04/30/2023 13:29    Pending Labs Unresulted Labs (From admission, onward)     Start     Ordered   05/02/23 0500  CBC  Tomorrow morning,   R        05/01/23 0626   05/02/23 0500  Basic metabolic panel  Tomorrow morning,   R        05/01/23 0626            Vitals/Pain Today's Vitals   05/01/23 1316 05/01/23 1337 05/01/23 1530  05/01/23 1616  BP: 134/81  (!) 133/109 (!) 99/59  Pulse: (!) 54  (!) 56 (!) 58  Resp: 18   18  Temp:  98.2 F (36.8 C)    TempSrc:      SpO2: 95%  97% 97%  Weight:      Height:      PainSc:        Isolation Precautions No active isolations  Medications Medications  enoxaparin (LOVENOX) injection 40 mg (40 mg Subcutaneous Given 04/30/23 2133)  acetaminophen (TYLENOL) tablet 650 mg (has no administration in time range)    Or  acetaminophen (TYLENOL) 160 MG/5ML solution 650 mg (has no administration in time range)    Or  acetaminophen (TYLENOL) suppository 650 mg (has no administration in time range)  senna-docusate (Senokot-S) tablet 1 tablet (has no administration in time range)  atorvastatin (LIPITOR) tablet 40 mg (40 mg Oral Given 05/01/23 1025)  tiZANidine (ZANAFLEX) tablet 4 mg (has no administration in time range)  clopidogrel (PLAVIX) tablet 300 mg (300 mg Oral Given 04/30/23 2133)    And  clopidogrel (PLAVIX) tablet 75 mg (75 mg Oral Given 05/01/23 1025)  aspirin EC tablet 81 mg (81 mg Oral Given 05/01/23 1025)   stroke: early stages of recovery book ( Does not apply Given 04/30/23 1833)  iohexol (OMNIPAQUE) 350 MG/ML injection 100 mL (100 mLs Intravenous Contrast Given 04/30/23 2048)    Mobility walks with device     Focused Assessments Neuro Assessment Handoff:  Swallow screen pass? Yes    NIH Stroke Scale  Dizziness Present: Yes Headache Present: Yes Interval: Shift assessment Level of Consciousness (1a.)   : Alert, keenly responsive LOC Questions (1b. )   : Answers both questions correctly LOC Commands (1c. )   : Performs both tasks correctly Best Gaze (2. )  : Normal Visual (3. )  : No visual loss Facial Palsy (4. )    : Normal symmetrical movements Motor Arm, Left (5a. )   : No drift Motor Arm, Right (5b. ) : No drift Motor Leg, Left (6a. )  : No drift Motor Leg, Right (6b. ) : No drift Limb Ataxia (7. ): Present in one limb Sensory (8. )  : Normal, no  sensory loss Best Language (9. )  : No aphasia Dysarthria (10. ): Normal Extinction/Inattention (11.)   : No Abnormality Complete NIHSS TOTAL: 1     Neuro Assessment: Exceptions to WDL Neuro Checks:   Shift assessment (04/30/23 1729)  Has TPA been given?  No If patient is a Neuro Trauma and patient is going to OR before floor call report to 4N Charge nurse: [phone] or [phone]   R Recommendations: See Admitting Provider Not  Report given to:   Additional Notes:

## 2023-05-01 NOTE — Progress Notes (Addendum)
STROKE TEAM PROGRESS NOTE   BRIEF HPI [name] is a 72 y.o. male with history of athritis, chronic back pain, DVT fully treated with coumadin, HTN presenting with unsteadiness.   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 Will need medication education reinforced prior to discharge   OBJECTIVE  CBC    Component Value Date/Time   WBC 4.9 04/30/2023 0932   RBC 4.77 04/30/2023 0932   HGB 14.8 04/30/2023 0932   HCT 44.3 04/30/2023 0932   PLT 230 04/30/2023 0932   MCV 92.9 04/30/2023 0932   MCH 31.0 04/30/2023 0932   MCHC 33.4 04/30/2023 0932   RDW 12.1 04/30/2023 0932   LYMPHSABS 1.5 06/28/2009 0858   MONOABS 0.4 06/28/2009 0858   EOSABS 0.1 06/28/2009 0858   BASOSABS 0.0 06/28/2009 0858    BMET    Component Value Date/Time   NA 133 (L) 04/30/2023 0932   K 3.9 04/30/2023 0932   CL 102 04/30/2023 0932   CO2 22 04/30/2023 0932   GLUCOSE 99 04/30/2023 0932   BUN 14 04/30/2023 0932   CREATININE 1.13 04/30/2023 0932   CREATININE 1.13 10/11/2022 0000   CALCIUM 9.3 04/30/2023 0932   EGFR 70 10/11/2022 0000   GFRNONAA >60 04/30/2023 0932    IMAGING past 24 hours CT ANGIO HEAD NECK W WO CM  Result Date: 04/30/2023 CLINICAL DATA:  Follow-up examination for stroke. EXAM: CT ANGIOGRAPHY HEAD AND NECK WITH AND WITHOUT CONTRAST TECHNIQUE: Multidetector CT imaging of the head and neck was performed using the standard protocol during bolus administration of intravenous contrast. Multiplanar CT image reconstructions and MIPs were obtained to evaluate the vascular anatomy. Carotid stenosis measurements (when applicable) are obtained utilizing NASCET criteria, using the distal internal carotid diameter as the denominator. RADIATION DOSE REDUCTION: This exam was performed according  to the departmental dose-optimization program which includes automated exposure control, adjustment of the mA and/or kV according to patient size and/or use of iterative reconstruction technique. CONTRAST:  OMNIPAQUE IOHEXOL 350 MG/ML SOLN COMPARISON:  Prior studies from earlier the same day. FINDINGS: CTA NECK FINDINGS Aortic arch: Visualized aortic arch normal in caliber with standard branch pattern. No stenosis about the origin of the great vessels. Right carotid system: Right common and internal carotid arteries are patent without dissection. Atheromatous change about the right carotid bulb with associated stenosis of up to approximately 50% by NASCET criteria. Left carotid system: Left common and internal carotid arteries are patent without evidence for dissection. No hemodynamically significant stenosis about the left carotid artery system. Vertebral arteries: Both vertebral arteries arise from subclavian arteries. No proximal subclavian artery stenosis. Left vertebral artery patent without stenosis or dissection. 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 Skeleton: No discrete or worrisome osseous lesions. 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 Other neck: No other acute abnormality within the neck. Upper chest: Visualized upper chest demonstrates no acute finding. Review of the MIP images confirms the above findings CTA HEAD FINDINGS Anterior circulation: She both internal carotid arteries are patent to the termini without stenosis or other abnormality. A1 segments, anterior communicating complex common anterior cerebral arteries patent without stenosis. No M1 stenosis or occlusion. No proximal MCA branch occlusion or high-grade stenosis. Distal MCA branches perfused and symmetric. Posterior circulation: Left V4  segment widely patent. Left PICA patent. Retrograde filling of the right V4 segment which is  widely patent without stenosis. Distal right V4 segment is fenestrated/duplicated. Right PICA patent. Basilar diminutive but patent without stenosis. Superior cerebral arteries patent bilaterally. Both PCA supplied via hypoplastic P1 segments and robust bilateral posterior communicating arteries. Both PCAs patent without stenosis. Venous sinuses: Grossly patent allowing for timing the contrast bolus. Anatomic variants: As above. Review of the MIP images confirms the above findings IMPRESSION: 1. Negative CTA for acute large vessel occlusion or other emergent finding. 2. 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 Retrograde filling of the right V4 segment which is widely patent without stenosis. 3. 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 4. No other hemodynamically significant or correctable stenosis about the major arterial vasculature of the head and neck. Electronically Signed   By: Rise Mu M.D.   On: 04/30/2023 21:48   MR BRAIN WO CONTRAST  Result Date: 04/30/2023 CLINICAL DATA:  Neuro deficit, acute, stroke suspected. Acute balance disturbance. EXAM: MRI HEAD WITHOUT CONTRAST TECHNIQUE: Multiplanar, multiecho pulse sequences of the brain and surrounding structures were obtained without intravenous contrast. COMPARISON:  Head CT same day FINDINGS: Brain: Diffusion imaging shows an acute infarction at the right posterolateral medulla. No other acute insult. Otherwise, there is no visible abnormality affecting the brainstem or cerebellum. Cerebral hemispheres show mild chronic small-vessel change of the white matter but no large vessel territory stroke. No mass, hemorrhage, hydrocephalus or extra-axial collection. Vascular: Major vessels at the base of the brain show flow. Skull and upper cervical spine: Negative Sinuses/Orbits: Paranasal sinuses are clear. There are  bilateral mastoid effusions, more extensive on the left than the right. Orbits negative. Other: None IMPRESSION: 1. 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 2. Mild chronic small-vessel ischemic change of the cerebral hemispheric white matter. 3. Bilateral mastoid effusions, more extensive on the left than the right. Electronically Signed   By: Paulina Fusi M.D.   On: 04/30/2023 17:12   CT Head Wo Contrast  Result Date: 04/30/2023 CLINICAL DATA:  Dizziness, altered mental status EXAM: CT HEAD WITHOUT CONTRAST TECHNIQUE: Contiguous axial images were obtained from the base of the skull through the vertex without intravenous contrast. RADIATION DOSE REDUCTION: This exam was performed according to the departmental dose-optimization program which includes automated exposure control, adjustment of the mA and/or kV according to patient size and/or use of iterative reconstruction technique. COMPARISON:  CT head 05/30/2022 FINDINGS: Brain: There is no acute intracranial hemorrhage, extra-axial fluid collection, or acute infarct. Parenchymal volume is normal. The ventricles are normal in size. Gray-white differentiation is preserved The pituitary and suprasellar region are normal. There is no mass lesion. There is no mass effect or midline shift. Vascular: No hyperdense vessel or unexpected calcification. Skull: Normal. Negative for fracture or focal lesion. Sinuses/Orbits: The imaged paranasal sinuses are clear. Bilateral lens implants are in place. The imaged globes and orbits are otherwise unremarkable. 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 IMPRESSION: No acute intracranial pathology. Electronically Signed   By: Lesia Hausen M.D.   On: 04/30/2023 13:37   DG Chest 2 View  Result Date: 04/30/2023 CLINICAL DATA:  Syncope, unsteady gait. EXAM: CHEST - 2 VIEW COMPARISON:  Chest radiograph 04/24/2023 FINDINGS: The cardiomediastinal silhouette is normal There is no focal  consolidation or pulmonary edema. There is no pleural effusion  or pneumothorax There is no acute osseous abnormality. IMPRESSION: Stable chest with no radiographic evidence of acute cardiopulmonary process. Electronically Signed   By: Lesia Hausen M.D.   On: 04/30/2023 13:29    Vitals:   04/30/23 2232 05/01/23 0124 05/01/23 0520 05/01/23 0815  BP: (!) 127/58 106/68 102/68 119/83  Pulse: (!) 55 (!) 57 62 (!) 54  Resp: 16 16 16 17   Temp: 97.9 F (36.6 C) 98.3 F (36.8 C) 97.9 F (36.6 C)   TempSrc: Oral  Axillary   SpO2: 99% 96% 98% 97%  Weight:      Height:         PHYSICAL EXAM General:  Alert, well-nourished, well-developed patient in no acute distress Psych:  Mood and affect appropriate for situation CV: Regular rate and rhythm on monitor Respiratory:  Regular, unlabored respirations on room air GI: Abdomen soft and nontender   NEURO:  Mental Status: AA&Ox3, patient is able to give clear and coherent history Speech/Language: speech is without dysarthria or aphasia.  Naming, repetition, fluency, and comprehension intact.  Cranial Nerves:  II: PERRL. Visual fields full.  III, IV, VI: EOMI. Eyelids elevate symmetrically.  V: Sensation is intact to light touch and symmetrical to face.  VII: Face is symmetrical resting and smiling VIII: hearing intact to voice. IX, X: Palate elevates symmetrically. Phonation is normal.  VW:[id number] shrug 5/5. XII: tongue is midline without fasciculations. Motor: 5/5 strength to all muscle groups tested.  Tone: is normal and bulk is normal Sensation- Intact to light touch bilaterally. Extinction absent to light touch to DSS.   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 Retrograde filling of the right V4 segment which is widely patent without stenosis.  Right ICA up to 50% stenosis MRI  acute infarct at the right posterolateral medulla 2D Echo EF 60-65% LDL 146 HgbA1c 5.3 UDS negative VTE prophylaxis - lovenox No antithrombotic prior to admission, now on aspirin 81 mg daily and clopidogrel 75 mg daily for 3 months and then aspirin 81mg  alone given right VA occlusion. Therapy recommendations:  Home Health PT Disposition:  pending   Hypertension Home meds:  hydrochlorothiazide Stable Long term BP goal normotensive  Hyperlipidemia Home meds:  None LDL 146, goal < 70 Add atorvastatin 40 Continue statin at discharge  Other Stroke Risk Factors Obesity, Body mass index is 33.99 kg/m., BMI >/= 30 associated with increased stroke risk, recommend weight loss, diet and exercise as appropriate  Hx of DVT in 2005  Other acute issues Chronic lower back pain   Hospital day # 1  Patient seen and examined by NP/APP with MD. MD to update note as needed.   Elmer Picker, DNP, FNP-BC [facility] Pager: [phone]  ATTENDING NOTE: I reviewed above note and agree with the assessment and plan. Pt was seen and examined.   No family at bedside.  Patient lying in bed, AOx3, no aphasia, follows simple commands.  No focal neurologic deficit except right upper and lower extremity mild dysmetria.  MRI showed right lateral medullary small infarct.  CTA head and neck showed right ICA up to 50% stenosis, and right VA origin occlusion with retrograde flow right V4.  Stroke etiology likely due to  right VA occlusion.  Continue DAPT for 3 months and then aspirin alone.  Add Lipitor 40.  PT and OT recommend home health.  For detailed assessment and plan, please refer to above/below as I have made changes wherever appropriate.   Neurology will sign off. Please call with questions. Pt will follow up with stroke clinic NP at [facility]  in about 4 weeks. Thanks for the consult.   Marvel Plan, MD PhD Stroke Neurology 05/01/2023 6:55 PM    To contact Stroke Continuity provider, please refer to [email or web address]. After hours, contact General Neurology

## 2023-05-01 NOTE — Evaluation (Signed)
Physical Therapy Evaluation Patient Details Name: Brent Norman MRN: [id number] DOB: 05-Oct-1951 Today's Date: 05/01/2023  History of Present Illness  Pt is a 72 y.o. male presenting 6/24 with ataxia. 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 PMH significant of HTN and chronic back pain  Clinical Impression  Pt presents to PT with deficits in gait, balance, endurance, strength, power. Pt with lateral instability when ambulating, reports he feels he is losing his balance to R side. PT does note a tendency for R drift, as well as tendency for toe walking on RLE, although pt reports this is baseline. Pt demonstrates improved stability with RW at this time and will benefit from a RW at the time of discharge. Acute PT will continue to follow during this admission for further dynamic gait and balance assessment.       Recommendations for follow up therapy are one component of a multi-disciplinary discharge planning process, led by the attending physician.  Recommendations may be updated based on patient status, additional functional criteria and insurance authorization.  Follow Up Recommendations       Assistance Recommended at Discharge PRN  Patient can return home with the following  A little help with bathing/dressing/bathroom;Assistance with cooking/housework;Assist for transportation;Help with stairs or ramp for entrance    Equipment Recommendations Rolling walker (2 wheels)  Recommendations for Other Services       Functional Status Assessment Patient has had a recent decline in their functional status and demonstrates the ability to make significant improvements in function in a reasonable and predictable amount of time.     Precautions / Restrictions Precautions Precautions: Fall Restrictions Weight Bearing Restrictions: No      Mobility  Bed Mobility Overal bed mobility: Independent                  Transfers Overall transfer level: Modified  independent                      Ambulation/Gait Ambulation/Gait assistance: Supervision Gait Distance (Feet): 150 Feet (additional trial of 100' with RW) Assistive device: Rolling walker (2 wheels), None Gait Pattern/deviations: Step-through pattern Gait velocity: functional Gait velocity interpretation: 1.31 - 2.62 ft/sec, indicative of limited community ambulator   General Gait Details: tendency for R lateral drift, increased lateral sway, improved stability with UE support of RW. Pt ambulates with toe strike bilaterally, reports this is baseline  Careers information officer    Modified Rankin (Stroke Patients Only)       Balance Overall balance assessment: Needs assistance Sitting-balance support: No upper extremity supported, Feet supported Sitting balance-Leahy Scale: Good     Standing balance support: No upper extremity supported, During functional activity Standing balance-Leahy Scale: Fair           Rhomberg - Eyes Opened: 25                   Pertinent Vitals/Pain Pain Assessment Pain Assessment: No/denies pain    Home Living Family/patient expects to be discharged to:: Private residence Living Arrangements: Spouse/significant other;Children Available Help at Discharge: Family;Available 24 hours/day Type of Home: House Home Access: Stairs to enter Entrance Stairs-Rails: None Entrance Stairs-Number of Steps: 4   Home Layout: One level Home Equipment: Other (comment) (walking stick)      Prior Function Prior Level of Function : Independent/Modified Independent  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        Extremity/Trunk Assessment   Upper Extremity Assessment Upper Extremity Assessment: RUE deficits/detail RUE Coordination: decreased fine motor    Lower Extremity Assessment Lower Extremity Assessment: RLE deficits/detail RLE Deficits / Details: grossly 4+/5,  slowed heel to shin    Cervical / Trunk Assessment Cervical / Trunk Assessment: Normal  Communication   Communication: No difficulties  Cognition Arousal/Alertness: Awake/alert Behavior During Therapy: WFL for tasks assessed/performed Overall Cognitive Status: Within Functional Limits for tasks assessed                                          General Comments General comments (skin integrity, edema, etc.): VSS on RA    Exercises     Assessment/Plan    PT Assessment Patient needs continued PT services  PT Problem List Decreased activity tolerance;Decreased balance;Decreased mobility       PT Treatment Interventions DME instruction;Gait training;Stair training;Functional mobility training;Balance training;Therapeutic activities;Therapeutic exercise;Neuromuscular re-education;Patient/family education    PT Goals (Current goals can be found in the Care Plan section)  Acute Rehab PT Goals Patient Stated Goal: to return to independence PT Goal Formulation: With patient Time For Goal Achievement: 05/15/23 Potential to Achieve Goals: Good Additional Goals Additional Goal #1: Pt will score >19/24 on the DGI to indicate a reduced risk for falls    Frequency Min 3X/week     Co-evaluation               AM-PAC PT "6 Clicks" Mobility  Outcome Measure Help needed turning from your back to your side while in a flat bed without using bedrails?: None Help needed moving from lying on your back to sitting on the side of a flat bed without using bedrails?: None Help needed moving to and from a bed to a chair (including a wheelchair)?: None Help needed standing up from a chair using your arms (e.g., wheelchair or bedside chair)?: None Help needed to walk in hospital room?: A Little Help needed climbing 3-5 steps with a railing? : A Little 6 Click Score: 22    End of Session Equipment Utilized During Treatment: Gait belt Activity Tolerance: Patient tolerated  treatment well Patient left: in bed;with call bell/phone within reach Nurse Communication: Mobility status PT Visit Diagnosis: Other abnormalities of gait and mobility (R26.89);Other symptoms and signs involving the nervous system (R29.898)    Time: 6644-0347 PT Time Calculation (min) (ACUTE ONLY): 21 min   Charges:   PT Evaluation $PT Eval Low Complexity: 1 Low          Arlyss Gandy, PT, DPT Acute Rehabilitation Office [phone]   Arlyss Gandy 05/01/2023, 12:54 PM

## 2023-05-01 NOTE — Evaluation (Addendum)
Occupational Therapy Evaluation Patient Details Name: Brent Norman MRN: [id number] DOB: 04-04-1951 Today's Date: 05/01/2023   History of Present Illness Pt is a 72 y.o. male presenting 6/24 with ataxia. 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 PMH significant of HTN and chronic back pain   Clinical Impression   PTA, pt lived with his wife and was independent. Upon eval, pt with decreased balance, endurance coordination (LE>UE), and strength. Pt performing UB ADL with set-up and LB ADL with min guard A. Pt with poor balance requiring min A for functional mobility and as little as min guard when using a RW.  Pt with slight nystagms at end range tracking horizontally to the L. Pt will continue to benefit from acute skilled OT. Recommending OP OT to optimize safety and independence in ADL and IADL; may be able to progress to no OT pending progress.      Recommendations for follow up therapy are one component of a multi-disciplinary discharge planning process, led by the attending physician.  Recommendations may be updated based on patient status, additional functional criteria and insurance authorization.   Assistance Recommended at Discharge Intermittent Supervision/Assistance  Patient can return home with the following A little help with walking and/or transfers;A little help with bathing/dressing/bathroom;Assistance with cooking/housework;Assist for transportation;Help with stairs or ramp for entrance;Direct supervision/assist for financial management;Direct supervision/assist for medications management    Functional Status Assessment  Patient has had a recent decline in their functional status and demonstrates the ability to make significant improvements in function in a reasonable and predictable amount of time.  Equipment Recommendations  BSC/3in1;Other (comment) (RW)    Recommendations for Other Services       Precautions / Restrictions  Precautions Precautions: Fall Restrictions Weight Bearing Restrictions: No      Mobility Bed Mobility Overal bed mobility: Independent                  Transfers Overall transfer level: Needs assistance Equipment used: None Transfers: Sit to/from Stand Sit to Stand: Supervision                  Balance Overall balance assessment: Needs assistance Sitting-balance support: No upper extremity supported, Feet supported Sitting balance-Leahy Scale: Good     Standing balance support: No upper extremity supported, During functional activity Standing balance-Leahy Scale: Fair                             ADL either performed or assessed with clinical judgement   ADL Overall ADL's : Needs assistance/impaired Eating/Feeding: Independent   Grooming: Min guard;Standing Grooming Details (indicate cue type and reason): prop self on sink Upper Body Bathing: Set up;Sitting   Lower Body Bathing: Min guard;Sit to/from stand   Upper Body Dressing : Set up;Sitting   Lower Body Dressing: Min guard;Sit to/from stand   Toilet Transfer: Min guard;Ambulation;Rolling walker (2 wheels)   Toileting- Clothing Manipulation and Hygiene: Set up;Sitting/lateral lean       Functional mobility during ADLs: Min guard;Rolling walker (2 wheels) General ADL Comments: Min A without RW. Pt endorsing mild headache above R eye with head turns sitting EOB as well as cervical flexion/extension.     Vision Baseline Vision/History: 1 Wears glasses (reading) Ability to See in Adequate Light: 0 Adequate Patient Visual Report: Eye fatigue/eye pain/headache (Headache above R eye, but denies actual visual changes) Vision Assessment?: Vision impaired- to be further tested in functional context;Yes Tracking/Visual Pursuits:  (  mild nystagmus at end ranges esp horizontally on the L) Convergence: Within functional limits Visual Fields: No apparent deficits Additional Comments: needs  further assessment. unable to read therapist name badge reporting he "has never been able to read aloud". When asked if words on botton were in reference to hospital name or therapist job, able to report therapist job correctly.     Perception     Praxis      Pertinent Vitals/Pain Pain Assessment Pain Assessment: No/denies pain     Hand Dominance  (ambidextrious per pt, writes with R)   Extremity/Trunk Assessment Upper Extremity Assessment Upper Extremity Assessment: RUE deficits/detail RUE Deficits / Details: decr strength and dexterity as compared to R RUE Sensation:  (denies changes) RUE Coordination: decreased fine motor   Lower Extremity Assessment Lower Extremity Assessment: Defer to PT evaluation   Cervical / Trunk Assessment Cervical / Trunk Assessment: Normal   Communication Communication Communication: No difficulties   Cognition Arousal/Alertness: Awake/alert Behavior During Therapy: WFL for tasks assessed/performed Overall Cognitive Status: Impaired/Different from baseline Area of Impairment: Problem solving, Following commands, Memory                     Memory: Decreased short-term memory Following Commands: Follows one step commands consistently, Follows one step commands with increased time     Problem Solving: Slow processing General Comments: Pt able to recall 2/3 on delayed memory recall. Fair awareness of current functional limitations. Pt with difficulty with simple money managment.     General Comments  VSS on RA    Exercises     Shoulder Instructions      Home Living Family/patient expects to be discharged to:: Private residence Living Arrangements: Spouse/significant other;Children Available Help at Discharge: Family;Available 24 hours/day Type of Home: House Home Access: Stairs to enter [employer] of Steps: 4 Entrance Stairs-Rails: None Home Layout: One level     Bathroom Shower/Tub: Higher education careers adviser: Standard     Home Equipment: Other (comment) (walking stick)          Prior Functioning/Environment Prior Level of Function : Independent/Modified Independent;Driving             Mobility Comments: enjoys working on his car ADLs Comments: Indep and driving        OT Problem List: Decreased strength;Decreased activity tolerance;Impaired balance (sitting and/or standing);Decreased cognition;Decreased safety awareness;Decreased knowledge of use of DME or AE;Decreased coordination;Impaired vision/perception      OT Treatment/Interventions: Self-care/ADL training;Therapeutic exercise;DME and/or AE instruction;Cognitive remediation/compensation;Therapeutic activities;Balance training;Visual/perceptual remediation/compensation;Patient/family education    OT Goals(Current goals can be found in the care plan section) Acute Rehab OT Goals Patient Stated Goal: get better/dont fall OT Goal Formulation: With patient Time For Goal Achievement: 05/15/23 Potential to Achieve Goals: Good ADL Goals Pt Will Perform Grooming: with modified independence;standing Pt Will Perform Upper Body Dressing: with modified independence;sitting Pt Will Perform Lower Body Dressing: with modified independence;sit to/from stand Pt Will Transfer to Toilet: with modified independence;ambulating;regular height toilet  OT Frequency: Min 2X/week    Co-evaluation              AM-PAC OT "6 Clicks" Daily Activity     Outcome Measure Help from another person eating meals?: None Help from another person taking care of personal grooming?: A Little Help from another person toileting, which includes using toliet, bedpan, or urinal?: A Little Help from another person bathing (including washing, rinsing, drying)?: A Little Help from another person to put on  and taking off regular upper body clothing?: A Little Help from another person to put on and taking off regular lower body clothing?: A Little 6 Click  Score: 19   End of Session Equipment Utilized During Treatment: Gait belt Nurse Communication: Mobility status  Activity Tolerance: Patient tolerated treatment well Patient left: in bed;with call bell/phone within reach  OT Visit Diagnosis: Unsteadiness on feet (R26.81);Muscle weakness (generalized) (M62.81);Other symptoms and signs involving cognitive function                Time: 1431-1500 OT Time Calculation (min): 29 min Charges:  OT General Charges $OT Visit: 1 Visit OT Evaluation $OT Eval Moderate Complexity: 1 Mod OT Treatments $Self Care/Home Management : 8-22 mins  Tyler Deis, OTR/L [facility] Acute Rehabilitation Office: [phone]   Myrla Halsted 05/01/2023, 3:59 PM

## 2023-05-01 NOTE — Progress Notes (Signed)
[facility]                                                                              Brent Norman, is a 72 y.o. male, DOB - 1950/11/08, YNW:[id number] Admit date - 04/30/2023    Outpatient Primary MD for the patient is Fleet Contras, MD  LOS - 1  days  Chief Complaint  Patient presents with   Near Syncope   Eye Problem       Brief summary   Patient is a 72 year old male with with HTN, chronic back pain presented with ataxia.  He patient reportedly got up to walk around 1230-1300 a day before the admission and had to hold onto the refrigerator to walk.  This continued throughout the day but progressively worse.  On the day of admission, he was running into the wall while trying to walk.  He has a congenital RLE associated ambulatory dysfunction however this was different from his baseline.  No dysarthria, focal weakness Neurology was consulted.  MRI brain showed acute infarction at the right posterior lateral medulla, mild chronic small vessel ischemic change of the cerebral hemispheric white matter, bilateral mastoid effusions.  Assessment & Plan    Principal Problem:   Acute CVA (cerebrovascular accident) (HCC) -Presented with ataxia -MRI showed acute infarction in the right posterior lateral marginal, mild chronic small vessel ischemic changes -Neurology consulted, started on aspirin 81 mg daily, Plavix 75 mg daily after loading dose -CTA head and neck showed negative CT for acute large vessel occlusion or other emergent finding.  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  Retrograde filling of the right V4 segment, widely patent without stenosis. -2D echo pending -Follow PT OT ST evaluation -Lipid panel showed cholesterol 209, HDL 38, LDL 146, TG 134, continue Lipitor 40 mg daily -Hemoglobin A1c 5.3  Active Problems:   Essential hypertension -Permissive hypertension for now, hold HCTZ, plan  to restart in 48 to 72 hours   Hyperlipidemia -Lipid panel showed cholesterol 209, HDL 38, LDL 146, TG 134 -Started lipitor 40 mg daily  Chronic back pain -Currently stable, no acute issues    Class 1 obesity due to excess calories with body mass index (BMI) of 33.0 to 33.9 in adult --Estimated body mass index is 33.99 kg/m as calculated from the following:   Height as of this encounter: 5\' 4"  (1.626 m).   Weight as of this encounter: 89.8 kg.  Code Status: Full code DVT Prophylaxis:  enoxaparin (LOVENOX) injection 40 mg Start: 04/30/23 2200   Level of Care: Level of care: Telemetry Medical Family Communication: Updated patient Disposition Plan:      Remains inpatient appropriate: PT OT, ST, 2D echo pending   Procedures:  MRI brain, CTA head and neck  Consultants:   Neurology  Antimicrobials: None      Medications  aspirin EC  81 mg Oral Daily   atorvastatin  40 mg Oral Daily   clopidogrel  75 mg Oral Daily   enoxaparin (LOVENOX) injection  40 mg Subcutaneous Q24H      Subjective:   Brent Norman was seen and examined today.  Have not ambulated yet.  No nausea vomiting, chest pain, shortness of breath, fevers.  No dysarthria.  No headache, visual problems or focal weakness.  Objective:   Vitals:   05/01/23 0124 05/01/23 0520 05/01/23 0815 05/01/23 0937  BP: 106/68 102/68 119/83   Pulse: (!) 57 62 (!) 54   Resp: 16 16 17    Temp: 98.3 F (36.8 C) 97.9 F (36.6 C)  98 F (36.7 C)  TempSrc:  Axillary    SpO2: 96% 98% 97%   Weight:      Height:       No intake or output data in the 24 hours ending 05/01/23 0950   Wt Readings from Last 3 Encounters:  04/30/23 89.8 kg  05/30/22 86.2 kg  09/10/19 90.7 kg     Exam General: Alert and oriented x 3, NAD Cardiovascular: S1 S2 auscultated,  RRR Respiratory: Clear to auscultation bilaterally, no wheezing Gastrointestinal: Soft, nontender, nondistended, + bowel sounds Ext: no pedal edema  bilaterally Neuro: Strength 5/5 upper and lower extremities bilaterally Psych: Normal affect     Data Reviewed:  I have personally reviewed following labs    CBC Lab Results  Component Value Date   WBC 4.9 04/30/2023   RBC 4.77 04/30/2023   HGB 14.8 04/30/2023   HCT 44.3 04/30/2023   MCV 92.9 04/30/2023   MCH 31.0 04/30/2023   PLT 230 04/30/2023   MCHC 33.4 04/30/2023   RDW 12.1 04/30/2023   LYMPHSABS 1.5 06/28/2009   MONOABS 0.4 06/28/2009   EOSABS 0.1 06/28/2009   BASOSABS 0.0 06/28/2009     Last metabolic panel Lab Results  Component Value Date   NA 133 (L) 04/30/2023   K 3.9 04/30/2023   CL 102 04/30/2023   CO2 22 04/30/2023   BUN 14 04/30/2023   CREATININE 1.13 04/30/2023   GLUCOSE 99 04/30/2023   GFRNONAA >60 04/30/2023   GFRAA >60 09/10/2019   CALCIUM 9.3 04/30/2023   PROT 7.2 02/12/2023   ALBUMIN 3.5 02/12/2023   BILITOT 0.5 02/12/2023   ALKPHOS 84 02/12/2023   AST 28 02/12/2023   ALT 44 02/12/2023   ANIONGAP 9 04/30/2023    CBG (last 3)  Recent Labs    04/30/23 1242  GLUCAP 96      Coagulation Profile: No results for input(s): "INR", "PROTIME" in the last 168 hours.   Radiology Studies: I have personally reviewed the imaging studies  CT ANGIO HEAD NECK W WO CM  Result Date: 04/30/2023 CLINICAL DATA:  Follow-up examination for stroke. EXAM: CT ANGIOGRAPHY HEAD AND NECK WITH AND WITHOUT CONTRAST TECHNIQUE: Multidetector CT imaging of the head and neck was performed using the standard protocol during bolus administration of intravenous contrast. Multiplanar CT image reconstructions and MIPs were obtained to evaluate the vascular anatomy. Carotid stenosis measurements (when applicable) are obtained utilizing NASCET criteria, using the distal internal carotid diameter as the denominator. RADIATION DOSE REDUCTION: This exam was performed according to the departmental dose-optimization program which includes automated exposure control, adjustment of  the mA and/or kV according to patient size and/or use of iterative reconstruction technique. CONTRAST:  OMNIPAQUE IOHEXOL 350 MG/ML SOLN COMPARISON:  Prior studies from earlier the same day. FINDINGS: CTA NECK FINDINGS Aortic arch: Visualized aortic arch normal in caliber with standard branch pattern. No stenosis about the origin of the great vessels. Right carotid system: Right common and internal carotid arteries are patent without dissection. Atheromatous change about the right carotid  bulb with associated stenosis of up to approximately 50% by NASCET criteria. Left carotid system: Left common and internal carotid arteries are patent without evidence for dissection. No hemodynamically significant stenosis about the left carotid artery system. Vertebral arteries: Both vertebral arteries arise from subclavian arteries. No proximal subclavian artery stenosis. Left vertebral artery patent without stenosis or dissection. 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 Skeleton: No discrete or worrisome osseous lesions. 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 Other neck: No other acute abnormality within the neck. Upper chest: Visualized upper chest demonstrates no acute finding. Review of the MIP images confirms the above findings CTA HEAD FINDINGS Anterior circulation: She both internal carotid arteries are patent to the termini without stenosis or other abnormality. A1 segments, anterior communicating complex common anterior cerebral arteries patent without stenosis. No M1 stenosis or occlusion. No proximal MCA branch occlusion or high-grade stenosis. Distal MCA branches perfused and symmetric. Posterior circulation: Left V4 segment widely patent. Left PICA patent. Retrograde filling of the right V4 segment which is widely patent without stenosis. Distal right V4 segment is fenestrated/duplicated. Right PICA patent.  Basilar diminutive but patent without stenosis. Superior cerebral arteries patent bilaterally. Both PCA supplied via hypoplastic P1 segments and robust bilateral posterior communicating arteries. Both PCAs patent without stenosis. Venous sinuses: Grossly patent allowing for timing the contrast bolus. Anatomic variants: As above. Review of the MIP images confirms the above findings IMPRESSION: 1. Negative CTA for acute large vessel occlusion or other emergent finding. 2. 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 Retrograde filling of the right V4 segment which is widely patent without stenosis. 3. 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 4. No other hemodynamically significant or correctable stenosis about the major arterial vasculature of the head and neck. Electronically Signed   By: Rise Mu M.D.   On: 04/30/2023 21:48   MR BRAIN WO CONTRAST  Result Date: 04/30/2023 CLINICAL DATA:  Neuro deficit, acute, stroke suspected. Acute balance disturbance. EXAM: MRI HEAD WITHOUT CONTRAST TECHNIQUE: Multiplanar, multiecho pulse sequences of the brain and surrounding structures were obtained without intravenous contrast. COMPARISON:  Head CT same day FINDINGS: Brain: Diffusion imaging shows an acute infarction at the right posterolateral medulla. No other acute insult. Otherwise, there is no visible abnormality affecting the brainstem or cerebellum. Cerebral hemispheres show mild chronic small-vessel change of the white matter but no large vessel territory stroke. No mass, hemorrhage, hydrocephalus or extra-axial collection. Vascular: Major vessels at the base of the brain show flow. Skull and upper cervical spine: Negative Sinuses/Orbits: Paranasal sinuses are clear. There are bilateral mastoid effusions, more extensive on the left than the right. Orbits negative. Other: None  IMPRESSION: 1. 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 2. Mild chronic small-vessel ischemic change of the cerebral hemispheric white matter. 3. Bilateral mastoid effusions, more extensive on the left than the right. Electronically Signed   By: Paulina Fusi M.D.   On: 04/30/2023 17:12   CT Head Wo Contrast  Result Date: 04/30/2023 CLINICAL DATA:  Dizziness, altered mental status EXAM: CT HEAD WITHOUT CONTRAST TECHNIQUE: Contiguous axial images were obtained from the base of the skull through the vertex without intravenous contrast. RADIATION DOSE REDUCTION: This exam was performed according to the departmental dose-optimization program which includes automated exposure control, adjustment of the mA and/or kV  according to patient size and/or use of iterative reconstruction technique. COMPARISON:  CT head 05/30/2022 FINDINGS: Brain: There is no acute intracranial hemorrhage, extra-axial fluid collection, or acute infarct. Parenchymal volume is normal. The ventricles are normal in size. Gray-white differentiation is preserved The pituitary and suprasellar region are normal. There is no mass lesion. There is no mass effect or midline shift. Vascular: No hyperdense vessel or unexpected calcification. Skull: Normal. Negative for fracture or focal lesion. Sinuses/Orbits: The imaged paranasal sinuses are clear. Bilateral lens implants are in place. The imaged globes and orbits are otherwise unremarkable. 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 IMPRESSION: No acute intracranial pathology. Electronically Signed   By: Lesia Hausen M.D.   On: 04/30/2023 13:37   DG Chest 2 View  Result Date: 04/30/2023 CLINICAL DATA:  Syncope, unsteady gait. EXAM: CHEST - 2 VIEW COMPARISON:  Chest radiograph 04/24/2023 FINDINGS: The cardiomediastinal silhouette is normal There is no focal consolidation or pulmonary edema. There is no pleural effusion or pneumothorax There is no acute osseous  abnormality. IMPRESSION: Stable chest with no radiographic evidence of acute cardiopulmonary process. Electronically Signed   By: Lesia Hausen M.D.   On: 04/30/2023 13:29       Dorla Guizar M.D. [facility] 05/01/2023, 9:50 AM  Available via Epic secure chat 7am-7pm After 7 pm, please refer to night coverage provider listed on amion.

## 2023-05-02 DIAGNOSIS — I639 Cerebral infarction, unspecified: Secondary | ICD-10-CM | POA: Diagnosis not present

## 2023-05-02 LAB — CBC
HCT: 42.2 % (ref 39.0–52.0)
Hemoglobin: 14.1 g/dL (ref 13.0–17.0)
MCH: 30.9 pg (ref 26.0–34.0)
MCHC: 33.4 g/dL (ref 30.0–36.0)
MCV: 92.5 fL (ref 80.0–100.0)
Platelets: 208 10*3/uL (ref 150–400)
RBC: 4.56 MIL/uL (ref 4.22–5.81)
RDW: 12.2 % (ref 11.5–15.5)
WBC: 6 10*3/uL (ref 4.0–10.5)
nRBC: 0 % (ref 0.0–0.2)

## 2023-05-02 LAB — BASIC METABOLIC PANEL
Anion gap: 11 (ref 5–15)
BUN: 17 mg/dL (ref 8–23)
CO2: 20 mmol/L — ABNORMAL LOW (ref 22–32)
Calcium: 8.9 mg/dL (ref 8.9–10.3)
Chloride: 103 mmol/L (ref 98–111)
Creatinine, Ser: 1.22 mg/dL (ref 0.61–1.24)
GFR, Estimated: >60 mL/min (ref >60)
Glucose, Bld: 95 mg/dL (ref 70–99)
Potassium: 3.8 mmol/L (ref 3.5–5.1)
Sodium: 134 mmol/L — ABNORMAL LOW (ref 135–145)

## 2023-05-02 MED ORDER — CLOPIDOGREL BISULFATE 75 MG PO TABS: 75.0000 mg | ORAL_TABLET | Freq: Every day | ORAL | 0 refills | Status: AC

## 2023-05-02 MED ORDER — ASPIRIN 81 MG PO TBEC: 81.0000 mg | DELAYED_RELEASE_TABLET | Freq: Every day | ORAL | 0 refills | Status: AC

## 2023-05-02 MED ORDER — ATORVASTATIN CALCIUM 40 MG PO TABS: 40.0000 mg | ORAL_TABLET | Freq: Every day | ORAL | 0 refills | Status: AC

## 2023-05-02 MED ORDER — PANTOPRAZOLE SODIUM 40 MG PO TBEC: DELAYED_RELEASE_TABLET | ORAL | 0 refills | Status: DC

## 2023-05-02 MED ORDER — PANTOPRAZOLE SODIUM 40 MG PO TBEC
40.0000 mg | DELAYED_RELEASE_TABLET | Freq: Two times a day (BID) | ORAL | Status: DC
  Administered 2023-05-02: 40 mg via ORAL
  Filled 2023-05-02: qty 1

## 2023-05-02 MED ORDER — STROKE: EARLY STAGES OF RECOVERY BOOK
Status: AC
  Filled 2023-05-02: qty 1

## 2023-05-02 NOTE — Plan of Care (Signed)

## 2023-05-02 NOTE — Progress Notes (Signed)
Physical Therapy Treatment Patient Details Name: Brent Norman MRN: [id number] DOB: [birth year] Today's Date: 05/02/2023   History of Present Illness Pt is a 72 y.o. male presenting 6/24 with ataxia. 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 PMH significant of HTN and chronic back pain    PT Comments    Today's session focused on progressive functional mobility, AD management, and stair training. Pt with supervision for functional transfers using RW and ambulation of 200'. Pt with decreased R drift using the RW in today's session, reports more security in balance. Still requiring min verbal cueing for hand placement in transfers, and for RW proximity and placement during gait, able to correct immediately. Some level of difficulty navigating through obstacles, increased time and contact made with walls/corners/doorways. Pt will continue to benefit from skilled PT during their acute admission to facilitate improvements in gait training, AD management, and obstacle navigation. Pt to rec HHPT for follow-up, pt agreeable at this time.    Recommendations for follow up therapy are one component of a multi-disciplinary discharge planning process, led by the attending physician.  Recommendations may be updated based on patient status, additional functional criteria and insurance authorization.  Follow Up Recommendations       Assistance Recommended at Discharge PRN  Patient can return home with the following A little help with bathing/dressing/bathroom;Assistance with cooking/housework;Assist for transportation;Help with stairs or ramp for entrance   Equipment Recommendations  Rolling walker (2 wheels)    Recommendations for Other Services       Precautions / Restrictions Precautions Precautions: Fall Restrictions Weight Bearing Restrictions: No     Mobility  Bed Mobility Overal bed mobility: Independent                  Transfers Overall transfer level:  Needs assistance Equipment used: Rolling walker (2 wheels) Transfers: Sit to/from Stand Sit to Stand: Supervision           General transfer comment: Cues for hand placement, adequate BUE push off when correct placement    Ambulation/Gait Ambulation/Gait assistance: Supervision Gait Distance (Feet): 200 Feet Assistive device: Rolling walker (2 wheels) Gait Pattern/deviations: Step-through pattern (toe walker) Gait velocity: reduced Gait velocity interpretation: 1.31 - 2.62 ft/sec, indicative of limited community ambulator   General Gait Details: decreased R drift today, consistent with toe walking last session, some cues for proximity of RW, 2 instances of feet crossing midline with no LOB   Stairs Stairs: Yes Stairs assistance: Min guard Stair Management: One rail Right Number of Stairs: 3 General stair comments: Attempts to use bilateral handrail but reminded that home setup has no handrails, reports lack of confidence with not having support. Able to ascend and descend with step-to pattern using R handrail only.   Wheelchair Mobility    Modified Rankin (Stroke Patients Only) Modified Rankin (Stroke Patients Only) Pre-Morbid Rankin Score: No symptoms Modified Rankin: Moderate disability     Balance Overall balance assessment: Needs assistance Sitting-balance support: No upper extremity supported, Feet supported Sitting balance-Leahy Scale: Good     Standing balance support: No upper extremity supported, During functional activity, Bilateral upper extremity supported Standing balance-Leahy Scale: Fair Standing balance comment: Able to stand unsupported to stand and urinate, requires external support for dynamic balance                            Cognition Arousal/Alertness: Awake/alert Behavior During Therapy: WFL for tasks assessed/performed Overall Cognitive Status: Impaired/Different  from baseline Area of Impairment: Memory, Following commands,  Problem solving                     Memory: Decreased short-term memory Following Commands: Follows one step commands consistently, Follows one step commands with increased time     Problem Solving: Slow processing General Comments: Some diffiiculty with navigating the RW within the bathroom, requires cues for stepping over the threshold.        Exercises      General Comments        Pertinent Vitals/Pain Pain Assessment Pain Assessment: Faces Faces Pain Scale: No hurt    Home Living                          Prior Function            PT Goals (current goals can now be found in the care plan section) Acute Rehab PT Goals Patient Stated Goal: get home and work on his cars PT Goal Formulation: With patient Time For Goal Achievement: 05/15/23 Potential to Achieve Goals: Good Progress towards PT goals: Progressing toward goals    Frequency    Min 3X/week      PT Plan Current plan remains appropriate    Co-evaluation              AM-PAC PT "6 Clicks" Mobility   Outcome Measure  Help needed turning from your back to your side while in a flat bed without using bedrails?: None Help needed moving from lying on your back to sitting on the side of a flat bed without using bedrails?: None Help needed moving to and from a bed to a chair (including a wheelchair)?: None Help needed standing up from a chair using your arms (e.g., wheelchair or bedside chair)?: None Help needed to walk in hospital room?: A Little Help needed climbing 3-5 steps with a railing? : A Little 6 Click Score: 22    End of Session Equipment Utilized During Treatment: Gait belt Activity Tolerance: Patient tolerated treatment well Patient left: in chair;with call bell/phone within reach;with chair alarm set Nurse Communication: Mobility status PT Visit Diagnosis: Other abnormalities of gait and mobility (R26.89);Other symptoms and signs involving the nervous system  (R29.898)     Time: 8295-6213 PT Time Calculation (min) (ACUTE ONLY): 20 min  Charges:  $Therapeutic Activity: 8-22 mins                     Hendricks Milo, SPT  Acute Rehabilitation Services    Hendricks Milo 05/02/2023, 9:51 AM

## 2023-05-02 NOTE — TOC Transition Note (Signed)
Transition of Care [facility]) - CM/SW Discharge Note   Patient Details  Name: Brent Norman MRN: [id number] Date of Birth: 1951/06/30  [facility] CM/SW Contact:  Kermit Balo, RN Phone Number: 05/02/2023, 11:24 AM   Clinical Narrative:    Pt is discharging home with home health services through [facility]. Information on the AVS. The home health will contact the patient for the first home visit.  Walker for home ordered through [facility] and will be delivered to the room. Pt manages his own medications at home and denies any issues.  Pt or spouse can provide transportation.  Wife transporting home today.   Final next level of care: Home w Home Health Services Barriers to Discharge: No Barriers Identified   Patient Goals and CMS Choice CMS Medicare.gov Compare Post Acute Care list provided to:: Patient Choice offered to / list presented to : Patient  Discharge Placement                         Discharge Plan and Services Additional resources added to the After Visit Summary for                  DME Arranged: Walker rolling DME Agency: [facility] Date DME Agency Contacted: 05/02/23   Representative spoke with at DME Agency: [facility] HH Arranged: PT, OT HH Agency: [facility] Date HH Agency Contacted: 05/02/23   Representative spoke with at [facility] Agency: Morrie Sheldon  Social Determinants of Health (SDOH) Interventions SDOH Screenings   Food Insecurity: No Food Insecurity (05/01/2023)  Housing: Low Risk  (05/01/2023)  Transportation Needs: No Transportation Needs (05/01/2023)  Utilities: Not At Risk (05/01/2023)  Tobacco Use: Low Risk  (05/01/2023)     Readmission Risk Interventions     No data to display

## 2023-05-02 NOTE — Progress Notes (Signed)
Mobility Specialist Progress Note   05/02/23 1103  Mobility  Activity Transferred from chair to bed  Level of Assistance Contact guard assist, steadying assist  Assistive Device Front wheel walker  Distance Ambulated (ft) 2 ft  Activity Response Tolerated well  Mobility Referral Yes  $Mobility charge 1 Mobility  Mobility Specialist Start Time (ACUTE ONLY) 1050  Mobility Specialist Stop Time (ACUTE ONLY) 1103  Mobility Specialist Time Calculation (min) (ACUTE ONLY) 13 min   Pt requesting assistance to get from chair to bed d/t tiredness. Required CGA throughout with use of RW for a safe transfer. Pt left in bed with all needs met, call bell in reach and bed alarm on.  Frederico Hamman Mobility Specialist Please contact via SecureChat or  Rehab office at [phone]

## 2023-05-02 NOTE — Evaluation (Signed)
SLP Cancellation Note  Patient Details Name: OLAF MESA MRN: [id number] DOB: [birth year]   Cancelled treatment:       Reason Eval/Treat Not Completed: Other (comment) (pt to dc home today, per notes- HH PT advised, if indicated - SLP can be ordered at home) Pt with MD at this time.    Chales Abrahams 05/02/2023, 10:08 AM

## 2023-05-04 NOTE — Discharge Summary (Signed)
Physician Discharge Summary   Patient: Brent Norman MRN: [id number] DOB: Mar 06, 1951  Admit date:     04/30/2023  Discharge date: 05/02/2023  Discharge Physician: Lynden Oxford  PCP: Fleet Contras, MD  Recommendations at discharge:  Follow up with Neurology and pcp    Follow-up Information     [facility]. Schedule an appointment as soon as possible for a visit in 1 month(s).   Specialty: Neurology Why: stroke clinic Contact information: [address] [place] [address] [phone]        [facility] Follow up.   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 Retrograde filling of the right V4 segment which is widely patent without stenosis.  Right ICA up to 50% stenosis MRI  acute infarct at the right posterolateral medulla 2D Echo EF 60-65% LDL 146 lipitor 40 on discharge.  HgbA1c 5.3 UDS negative No antithrombotic prior to admission, now on aspirin 81 mg daily and clopidogrel 75 mg daily for 3 months and then aspirin 81mg  alone given right VA occlusion. Therapy recommendations:  Home Health PT   Hypertension Home meds:  hydrochlorothiazide Blood pressure Stable without meds Long term BP goal normotensive, for now we will hold the med on discharge.    Hyperlipidemia Home meds:  None LDL  146, goal < 70 Add atorvastatin 40   Obesity, Body mass index is 33.99 kg/m.  associated with increased stroke risk, recommend weight loss, diet and exercise as appropriate   Consultants:  Neurology   Procedures performed:  Echocardiogram   DISCHARGE MEDICATION: Allergies as of 05/02/2023   No Known Allergies      Medication List     STOP taking these medications    diclofenac 75 MG EC tablet Commonly known as: VOLTAREN   hydrochlorothiazide 12.5 MG tablet Commonly known as: HYDRODIURIL   potassium chloride 10 MEQ tablet Commonly known as: KLOR-CON       TAKE these medications    aspirin EC 81 MG tablet Take 1 tablet (81 mg total) by mouth daily. Swallow whole.   atorvastatin 40 MG tablet Commonly known as: LIPITOR Take 1 tablet (40 mg total) by mouth daily.   cetirizine 10 MG tablet Commonly known as: ZYRTEC Take 1 tablet (10 mg total) by mouth daily. What changed:  when to take this reasons to take this   clopidogrel 75 MG tablet Commonly known as: PLAVIX Take 1 tablet (75 mg total) by mouth daily.   Mens 50+ Multivitamin Tabs Take 1 tablet by mouth daily.   pantoprazole 40 MG tablet Commonly known as: PROTONIX Take 1 tablet (40 mg total) by mouth 2 (two) times daily before a meal for 14 days, THEN 1 tablet (40 mg total) daily for 14 days. Start taking on: May 02, 2023   tiZANidine 4 MG  tablet Commonly known as: ZANAFLEX Take 4 mg by mouth at bedtime as needed for muscle spasms.       Disposition: Home Diet recommendation: Cardiac diet  Discharge Exam: Vitals:   05/01/23 2000 05/01/23 2323 05/02/23 0336 05/02/23 0758  BP: (!) 84/39 (!) 96/59 94/66 113/68  Pulse: (!) 57 72 (!) 51 (!) 52  Resp: 17 16 17 20   Temp: 98.1 F (36.7 C) 98.1 F (36.7 C) 97.7 F (36.5 C) 97.6 F (36.4 C)  TempSrc: Oral Oral Oral Oral  SpO2: 97% 99% 99% 96%  Weight:      Height:       General: Appear in no distress; no visible Abnormal Neck Mass Or  lumps, Conjunctiva normal Cardiovascular: S1 and S2 Present, no Murmur, Respiratory: good respiratory effort, Bilateral Air entry present and CTA, no Crackles, no wheezes Abdomen: Bowel Sound present, Non tender  Extremities: no Pedal edema Neurology: alert and oriented to time, place, and person  [facility] Weights   04/30/23 0929  Weight: 89.8 kg   Condition at discharge: stable  The results of significant diagnostics from this hospitalization (including imaging, microbiology, ancillary and laboratory) are listed below for reference.   Imaging Studies: ECHOCARDIOGRAM COMPLETE  Result Date: 05/01/2023    ECHOCARDIOGRAM REPORT   Patient Name:   Brent Norman Date of Exam: 05/01/2023 Medical Rec #:  [id number]      Height:       64.0 in Accession #:    [id number]     Weight:       198.0 lb Date of Birth:  Jul 03, 1951      BSA:          1.948 m Patient Age:    72 years       BP:           134/81 mmHg Patient Gender: M              HR:           58 bpm. Exam Location:  Inpatient Procedure: 2D Echo, Cardiac Doppler and Color Doppler Indications:    Stroke  History:        Patient has no prior history of Echocardiogram examinations.                 TIA; Risk Factors:Hypertension.  Sonographer:    Darlys Gales Referring Phys: [id number] JENNIFER YATES IMPRESSIONS  1. Left ventricular ejection fraction, by estimation, is 60 to 65%. The left ventricle has normal function. The left ventricle has no regional wall motion abnormalities. Left ventricular diastolic parameters are consistent with Grade I diastolic dysfunction (impaired relaxation).  2. Right ventricular systolic function is normal. The right ventricular size is normal.  3. The mitral valve is normal in structure. Mild mitral valve regurgitation. No evidence of mitral stenosis.  4. The aortic valve is normal in structure. Aortic valve regurgitation is not visualized. No aortic stenosis is present.  5. The inferior vena cava is normal in size with greater than  50% respiratory variability, suggesting right atrial pressure of 3 mmHg. FINDINGS  Left Ventricle: Left ventricular ejection fraction, by estimation, is 60 to 65%. The left ventricle has normal function. The left ventricle has no regional wall motion abnormalities. The left ventricular internal cavity size was normal in size. There is  no left ventricular hypertrophy. Left ventricular diastolic parameters are consistent with Grade I diastolic dysfunction (impaired relaxation). Right Ventricle: The right ventricular size is normal. No increase in right ventricular  wall thickness. Right ventricular systolic function is normal. Left Atrium: Left atrial size was normal in size. Right Atrium: Right atrial size was normal in size. Pericardium: There is no evidence of pericardial effusion. Presence of epicardial fat layer. Mitral Valve: The mitral valve is normal in structure. Mild mitral valve regurgitation. No evidence of mitral valve stenosis. Tricuspid Valve: The tricuspid valve is normal in structure. Tricuspid valve regurgitation is not demonstrated. No evidence of tricuspid stenosis. Aortic Valve: The aortic valve is normal in structure. Aortic valve regurgitation is not visualized. No aortic stenosis is present. Aortic valve mean gradient measures 3.0 mmHg. Aortic valve peak gradient measures 5.1 mmHg. Aortic valve area, by VTI measures 2.14 cm. Pulmonic Valve: The pulmonic valve was normal in structure. Pulmonic valve regurgitation is not visualized. No evidence of pulmonic stenosis. Aorta: The aortic root is normal in size and structure. Venous: The inferior vena cava is normal in size with greater than 50% respiratory variability, suggesting right atrial pressure of 3 mmHg. IAS/Shunts: No atrial level shunt detected by color flow Doppler.  LEFT VENTRICLE PLAX 2D LVIDd:         4.90 cm   Diastology LVIDs:         2.60 cm   LV e' medial:    4.46 cm/s LV PW:         0.90 cm   LV E/e' medial:  14.6 LV IVS:         1.00 cm   LV e' lateral:   5.55 cm/s LVOT diam:     2.00 cm   LV E/e' lateral: 11.7 LV SV:         53 LV SV Index:   27 LVOT Area:     3.14 cm  RIGHT VENTRICLE RV S prime:     15.30 cm/s TAPSE (M-mode): 3.2 cm LEFT ATRIUM             Index        RIGHT ATRIUM           Index LA Vol (A2C):   30.4 ml 15.61 ml/m  RA Area:     10.20 cm LA Vol (A4C):   25.3 ml 12.99 ml/m  RA Volume:   21.80 ml  11.19 ml/m LA Biplane Vol: 29.8 ml 15.30 ml/m  AORTIC VALVE AV Area (Vmax):    2.77 cm AV Area (Vmean):   2.48 cm AV Area (VTI):     2.14 cm AV Vmax:           113.00 cm/s AV Vmean:          79.700 cm/s AV VTI:            0.249 m AV Peak Grad:      5.1 mmHg AV Mean Grad:      3.0 mmHg LVOT Vmax:         99.70 cm/s LVOT Vmean:        63.000 cm/s LVOT VTI:          0.170 m LVOT/AV VTI ratio: 0.68  AORTA Ao Root diam: 3.00 cm MITRAL VALVE MV Area (PHT): 1.85 cm    SHUNTS MV Decel Time: 410 msec    Systemic VTI:  0.17 m MV E velocity: 64.90 cm/s  Systemic Diam: 2.00 cm MV A velocity: 88.10 cm/s MV E/A ratio:  0.74 Kardie Tobb DO Electronically signed by Thomasene Ripple DO Signature Date/Time: 05/01/2023/4:21:47 PM    Final    CT ANGIO HEAD NECK  W WO CM  Result Date: 04/30/2023 CLINICAL DATA:  Follow-up examination for stroke. EXAM: CT ANGIOGRAPHY HEAD AND NECK WITH AND WITHOUT CONTRAST TECHNIQUE: Multidetector CT imaging of the head and neck was performed using the standard protocol during bolus administration of intravenous contrast. Multiplanar CT image reconstructions and MIPs were obtained to evaluate the vascular anatomy. Carotid stenosis measurements (when applicable) are obtained utilizing NASCET criteria, using the distal internal carotid diameter as the denominator. RADIATION DOSE REDUCTION: This exam was performed according to the departmental dose-optimization program which includes automated exposure control, adjustment of the mA and/or kV according to patient size and/or use of iterative reconstruction technique.  CONTRAST:  OMNIPAQUE IOHEXOL 350 MG/ML SOLN COMPARISON:  Prior studies from earlier the same day. FINDINGS: CTA NECK FINDINGS Aortic arch: Visualized aortic arch normal in caliber with standard branch pattern. No stenosis about the origin of the great vessels. Right carotid system: Right common and internal carotid arteries are patent without dissection. Atheromatous change about the right carotid bulb with associated stenosis of up to approximately 50% by NASCET criteria. Left carotid system: Left common and internal carotid arteries are patent without evidence for dissection. No hemodynamically significant stenosis about the left carotid artery system. Vertebral arteries: Both vertebral arteries arise from subclavian arteries. No proximal subclavian artery stenosis. Left vertebral artery patent without stenosis or dissection. 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 Skeleton: No discrete or worrisome osseous lesions. 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 Other neck: No other acute abnormality within the neck. Upper chest: Visualized upper chest demonstrates no acute finding. Review of the MIP images confirms the above findings CTA HEAD FINDINGS Anterior circulation: She both internal carotid arteries are patent to the termini without stenosis or other abnormality. A1 segments, anterior communicating complex common anterior cerebral arteries patent without stenosis. No M1 stenosis or occlusion. No proximal MCA branch occlusion or high-grade stenosis. Distal MCA branches perfused and symmetric. Posterior circulation: Left V4 segment widely patent. Left PICA patent. Retrograde filling of the right V4 segment which is widely patent without stenosis. Distal right V4 segment is fenestrated/duplicated. Right PICA patent. Basilar diminutive but patent without stenosis. Superior cerebral arteries patent bilaterally.  Both PCA supplied via hypoplastic P1 segments and robust bilateral posterior communicating arteries. Both PCAs patent without stenosis. Venous sinuses: Grossly patent allowing for timing the contrast bolus. Anatomic variants: As above. Review of the MIP images confirms the above findings IMPRESSION: 1. Negative CTA for acute large vessel occlusion or other emergent finding. 2. 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 Retrograde filling of the right V4 segment which is widely patent without stenosis. 3. 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 4. No other hemodynamically significant or correctable stenosis about the major arterial vasculature of the head and neck. Electronically Signed   By: Rise Mu M.D.   On: 04/30/2023 21:48   MR BRAIN WO CONTRAST  Result Date: 04/30/2023 CLINICAL DATA:  Neuro deficit, acute, stroke suspected. Acute balance disturbance. EXAM: MRI HEAD WITHOUT CONTRAST TECHNIQUE: Multiplanar, multiecho pulse sequences of the brain and surrounding structures were obtained without intravenous contrast. COMPARISON:  Head CT same day FINDINGS: Brain: Diffusion imaging shows an acute infarction at the right posterolateral medulla. No other acute insult. Otherwise, there is no visible abnormality affecting the brainstem or cerebellum. Cerebral hemispheres  show mild chronic small-vessel change of the white matter but no large vessel territory stroke. No mass, hemorrhage, hydrocephalus or extra-axial collection. Vascular: Major vessels at the base of the brain show flow. Skull and upper cervical spine: Negative Sinuses/Orbits: Paranasal sinuses are clear. There are bilateral mastoid effusions, more extensive on the left than the right. Orbits negative. Other: None IMPRESSION: 1. 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 2. Mild chronic small-vessel  ischemic change of the cerebral hemispheric white matter. 3. Bilateral mastoid effusions, more extensive on the left than the right. Electronically Signed   By: Paulina Fusi M.D.   On: 04/30/2023 17:12   CT Head Wo Contrast  Result Date: 04/30/2023 CLINICAL DATA:  Dizziness, altered mental status EXAM: CT HEAD WITHOUT CONTRAST TECHNIQUE: Contiguous axial images were obtained from the base of the skull through the vertex without intravenous contrast. RADIATION DOSE REDUCTION: This exam was performed according to the departmental dose-optimization program which includes automated exposure control, adjustment of the mA and/or kV according to patient size and/or use of iterative reconstruction technique. COMPARISON:  CT head 05/30/2022 FINDINGS: Brain: There is no acute intracranial hemorrhage, extra-axial fluid collection, or acute infarct. Parenchymal volume is normal. The ventricles are normal in size. Gray-white differentiation is preserved The pituitary and suprasellar region are normal. There is no mass lesion. There is no mass effect or midline shift. Vascular: No hyperdense vessel or unexpected calcification. Skull: Normal. Negative for fracture or focal lesion. Sinuses/Orbits: The imaged paranasal sinuses are clear. Bilateral lens implants are in place. The imaged globes and orbits are otherwise unremarkable. 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 IMPRESSION: No acute intracranial pathology. Electronically Signed   By: Lesia Hausen M.D.   On: 04/30/2023 13:37   DG Chest 2 View  Result Date: 04/30/2023 CLINICAL DATA:  Syncope, unsteady gait. EXAM: CHEST - 2 VIEW COMPARISON:  Chest radiograph 04/24/2023 FINDINGS: The cardiomediastinal silhouette is normal There is no focal consolidation or pulmonary edema. There is no pleural effusion or pneumothorax There is no acute osseous abnormality. IMPRESSION: Stable chest with no radiographic evidence of acute cardiopulmonary process.  Electronically Signed   By: Lesia Hausen M.D.   On: 04/30/2023 13:29   DG Chest 2 View  Result Date: 04/24/2023 CLINICAL DATA:  Chest pain EXAM: CHEST - 2 VIEW COMPARISON:  09/10/2019 FINDINGS: The heart size and mediastinal contours are within normal limits. Both lungs are clear. The visualized skeletal structures are unremarkable. IMPRESSION: No active cardiopulmonary disease. Electronically Signed   By: Alcide Clever M.D.   On: 04/24/2023 04:02    Microbiology: Results for orders placed or performed in visit on 11/24/21  SARS-COV-2 RNA,(COVID-19) QUAL NAAT     Status: None   Collection Time: 11/24/21 12:00 AM  Result Value Ref Range Status   SARS CoV2 RNA NOT DETECTED NOT DETECTED Final    Comment: . A Not Detected result means that SARS-CoV-2 RNA was not present in the specimen above the limit of detection. . A Not Detected result does not rule out the possibility of COVID-19 and should not be used as the sole basis for treatment or patient management decisions. If COVID-19 is still suspected, based on exposure history together with other clinical findings, re-testing should be  considered in the context of clinical observations and epidemiological data for patient management decisions. . Test Method: Nucleic Acid Amplification Test including reverse transcription polymerase chain reaction (RT-PCR) and transcription mediated amplification (TMA). The test method meets the [place]  Centers for Disease Control and prevention (CDC) pre departure and arrival requirement for viral test for COVID-19 dated December 04, 2019. Testing requirements for traveling may change with time. The patient is responsible for determining the test requirements for each nation while they ar e traveling. . This test has been authorized by the FDA under an  Emergency Use Authorization (EUA) for use by authorized laboratories. . Please review the "Fact Sheets" and FDA authorized labeling available for health  care providers and patients using the following websites: https://www.[facility].com/home/Covid-19/HCP/NAAT/fact-sheet2  https://www.[email or web address]/home/Covid-19/Patients/NAAT/ fact-sheet2 . Due to the current public health emergency, [facility] is accepting samples from appropriate clinical sources collected using wide variety of swabs and transport media for COVID-19. Not detected test results derived from specimens received in non- commercially manufactured viral collection kits or those not yet authorized by FDA for COVID-19 testing should be cautiously evaluated and take extra precautions such as additional clinical monitoring, including collection of an additional specimen. . Additional information about COVID-19 can be fo und at the [employer] website: www.[facility].com/Covid19. . For patients with a Detected or Inconclusive test result, please see CDC's COVID-19 Treatments and  Medications page located at  [employer]com.cy- severe-illness.html for information on COVID-19 therapeutics. . For patients with a Not Detected test result, please see CDC's Vaccines for COVID-19 page located at [employer]si for information on COVID-19 vaccines.    Labs: CBC: Recent Labs  Lab 04/30/23 0932 05/02/23 0356  WBC 4.9 6.0  HGB 14.8 14.1  HCT 44.3 42.2  MCV 92.9 92.5  PLT 230 208   Basic Metabolic Panel: Recent Labs  Lab 04/30/23 0932 05/02/23 0356  NA 133* 134*  K 3.9 3.8  CL 102 103  CO2 22 20*  GLUCOSE 99 95  BUN 14 17  CREATININE 1.13 1.22  CALCIUM 9.3 8.9   Liver Function Tests: No results for input(s): "AST", "ALT", "ALKPHOS", "BILITOT", "PROT", "ALBUMIN" in the last 168 hours. CBG: Recent Labs  Lab 04/30/23 1242  GLUCAP 96    Discharge time spent: greater than 30 minutes.  Author: Lynden Oxford, MD  [facility] 05/02/2023

## 2023-05-23 ENCOUNTER — Inpatient Hospital Stay: Payer: No Typology Code available for payment source | Admitting: Neurology

## 2023-05-23 ENCOUNTER — Encounter: Payer: Self-pay | Admitting: Neurology

## 2023-05-23 NOTE — Progress Notes (Deleted)
Patient: Brent Norman Date of Birth: 06-02-1951  Reason for Visit: Follow up History from: Patient Primary Neurologist: Pearlean Brownie    ASSESSMENT AND PLAN 72 y.o. year old male with right lateral medulla infarct.  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  Presented with unsteadiness.  Vascular risk factors of HTN, HLD.   HISTORY OF PRESENT ILLNESS: Today 05/23/23 Brent Norman is here for stroke clinic follow-up.  Went to the ER 04/30/2023 with unsteadiness.  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  LDL was 146, added Lipitor 40.  Home health PT was recommended.  At discharge only neurodeficit was right upper and lower extremity mild dysmetria.  -Code stroke CT head no acute abnormality -CTA head and neck occlusion of the right vertebral artery just distal to its origin, remains occluded within the neck.  Retrograde filling of the right middle V4 segment, widely patent without stenosis.  Right ICA up to 50% stenosis. -MRI of the brain acute infarct at the right posterolateral medulla -2D echo EF 60 to 65% -LDL 146 -A1c 5.3 -UDS negative -No antithrombotic prior to admission, aspirin 81 mg daily and Plavix 75 mg daily for 3 months, then aspirin 81 mg alone given right VA occlusion   HISTORY  04/30/23 Dr. Amada Jupiter HPI: Brent Norman is a 72 y.o. male who was in his normal state of health until yesterday afternoon at which point he noticed that he was staggering.  He went home and went to sleep, hoping it be better in the morning, but this morning he had to hold onto things to walk and therefore presented to the emergency department.  He denies any diplopia, numbness, weakness, just notes that he is unsteady.   REVIEW OF SYSTEMS: Out of a complete 14 system review of symptoms, the patient complains only of the following symptoms, and all other reviewed systems are  negative.  See HPI  ALLERGIES: No Known Allergies  HOME MEDICATIONS: Outpatient Medications Prior to Visit  Medication Sig Dispense Refill   aspirin EC 81 MG tablet Take 1 tablet (81 mg total) by mouth daily. Swallow whole. 180 tablet 0   atorvastatin (LIPITOR) 40 MG tablet Take 1 tablet (40 mg total) by mouth daily. 60 tablet 0   cetirizine (ZYRTEC) 10 MG tablet Take 1 tablet (10 mg total) by mouth daily. (Patient taking differently: Take 10 mg by mouth daily as needed for allergies.) 10 tablet 0   clopidogrel (PLAVIX) 75 MG tablet Take 1 tablet (75 mg total) by mouth daily. 90 tablet 0   Multiple Vitamins-Minerals (MENS 50+ MULTIVITAMIN) TABS Take 1 tablet by mouth daily.     pantoprazole (PROTONIX) 40 MG tablet Take 1 tablet (40 mg total) by mouth 2 (two) times daily before a meal for 14 days, THEN 1 tablet (40 mg total) daily for 14 days. 42 tablet 0   tiZANidine (ZANAFLEX) 4 MG tablet Take 4 mg by mouth at bedtime as needed for muscle spasms.     No facility-administered medications prior to visit.    PAST MEDICAL HISTORY: Past Medical History:  Diagnosis Date   Arthritis    Chronic back pain    DVT (deep venous thrombosis) (HCC)    HTN (hypertension)     PAST SURGICAL HISTORY: Past Surgical History:  Procedure Laterality Date   APPENDECTOMY      FAMILY HISTORY: No family history on file.  SOCIAL  HISTORY: Social History   Socioeconomic History   Marital status: Married    Spouse name: Not on file   Number of children: Not on file   Years of education: Not on file   Highest education level: Not on file  Occupational History   Not on file  Tobacco Use   Smoking status: Never   Smokeless tobacco: Never  Vaping Use   Vaping status: Never Used  Substance and Sexual Activity   Alcohol use: Never   Drug use: No   Sexual activity: Never  Other Topics Concern   Not on file  Social History Narrative   ** Merged History Encounter **       Social Determinants  of Health   Financial Resource Strain: Not on file  Food Insecurity: No Food Insecurity (05/01/2023)   Hunger Vital Sign    Worried About Running Out of Food in the Last Year: Never true    Ran Out of Food in the Last Year: Never true  Transportation Needs: No Transportation Needs (05/01/2023)   PRAPARE - Administrator, Civil Service (Medical): No    Lack of Transportation (Non-Medical): No  Physical Activity: Not on file  Stress: Not on file  Social Connections: Unknown (03/23/2023)   Received from [facility]   Social Network    Social Network: Not on file  Intimate Partner Violence: Not At Risk (05/01/2023)   Humiliation, Afraid, Rape, and Kick questionnaire    Fear of Current or Ex-Partner: No    Emotionally Abused: No    Physically Abused: No    Sexually Abused: No    PHYSICAL EXAM  There were no vitals filed for this visit. There is no height or weight on file to calculate BMI.  Generalized: Well developed, in no acute distress  Neurological examination  Mentation: Alert oriented to time, place, history taking. Follows all commands speech and language fluent Cranial nerve II-XII: Pupils were equal round reactive to light. Extraocular movements were full, visual field were full on confrontational test. Facial sensation and strength were normal. Uvula tongue midline. Head turning and shoulder shrug  were normal and symmetric. Motor: The motor testing reveals 5 over 5 strength of all 4 extremities. Good symmetric motor tone is noted throughout.  Sensory: Sensory testing is intact to soft touch on all 4 extremities. No evidence of extinction is noted.  Coordination: Cerebellar testing reveals good finger-nose-finger and heel-to-shin bilaterally.  Gait and station: Gait is normal. Tandem gait is normal. Romberg is negative. No drift is seen.  Reflexes: Deep tendon reflexes are symmetric and normal bilaterally.   DIAGNOSTIC DATA (LABS, IMAGING, TESTING) - I reviewed  patient records, labs, notes, testing and imaging myself where available.  Lab Results  Component Value Date   WBC 6.0 05/02/2023   HGB 14.1 05/02/2023   HCT 42.2 05/02/2023   MCV 92.5 05/02/2023   PLT 208 05/02/2023      Component Value Date/Time   NA 134 (L) 05/02/2023 0356   K 3.8 05/02/2023 0356   CL 103 05/02/2023 0356   CO2 20 (L) 05/02/2023 0356   GLUCOSE 95 05/02/2023 0356   BUN 17 05/02/2023 0356   CREATININE 1.22 05/02/2023 0356   CREATININE 1.13 10/11/2022 0000   CALCIUM 8.9 05/02/2023 0356   PROT 7.2 02/12/2023 2142   ALBUMIN 3.5 02/12/2023 2142   AST 28 02/12/2023 2142   ALT 44 02/12/2023 2142   ALKPHOS 84 02/12/2023 2142   BILITOT 0.5 02/12/2023 2142  GFRNONAA >60 05/02/2023 0356   GFRAA >60 09/10/2019 0859   Lab Results  Component Value Date   CHOL 209 (H) 05/01/2023   HDL 38 (L) 05/01/2023   LDLCALC 146 (H) 05/01/2023   TRIG 124 05/01/2023   CHOLHDL 5.5 05/01/2023   Lab Results  Component Value Date   HGBA1C 5.3 04/30/2023   No results found for: "VITAMINB12" Lab Results  Component Value Date   TSH 0.398 04/30/2023    Margie Ege, AGNP-C, DNP 05/23/2023, 5:26 AM [facility] [address] [place], [place] [address] [phone]

## 2023-05-29 ENCOUNTER — Ambulatory Visit (INDEPENDENT_AMBULATORY_CARE_PROVIDER_SITE_OTHER): Payer: No Typology Code available for payment source | Admitting: Neurology

## 2023-05-29 ENCOUNTER — Encounter: Payer: Self-pay | Admitting: Neurology

## 2023-05-29 VITALS — BP 119/70 | HR 80 | Ht 64.0 in | Wt 185.5 lb

## 2023-05-29 DIAGNOSIS — I639 Cerebral infarction, unspecified: Secondary | ICD-10-CM | POA: Diagnosis not present

## 2023-05-29 DIAGNOSIS — I10 Essential (primary) hypertension: Secondary | ICD-10-CM

## 2023-05-29 DIAGNOSIS — E785 Hyperlipidemia, unspecified: Secondary | ICD-10-CM | POA: Diagnosis not present

## 2023-05-29 NOTE — Progress Notes (Signed)
Patient: Brent Norman Date of Birth: 08/14/1951  Reason for Visit: Stroke Follow Up  History from: Patient, wife  Primary Neurologist: Pearlean Brownie   ASSESSMENT AND PLAN 72 y.o. year old male with acute stroke in the  right posterolateral medulla.  Likely due to large vessel disease from right VA occlusion.  Presented with unsteadiness.  Vascular risk factors: HTN. HLD.  On 3 months of DAPT aspirin and Plavix, then aspirin 81 mg alone.  Continues with chronic gait abnormality.  -Encouraged to continue home health physical therapy for gait training -Continue DAPT aspirin and Plavix until 07/31/23, then aspirin 81 mg daily alone for secondary stroke prevention -Strict management of vascular risk factors with a goal BP less than 130/90, A1c less than 7.0, LDL less than 70 for secondary stroke prevention -Keep close follow-up with PCP, will be due for lipid panel recheck 2-3 months -Follow-up at our office on an as-needed basis  HISTORY OF PRESENT ILLNESS: Today 05/29/23 Brent Norman is here for stroke clinic follow-up.  Admitted 04/30/2023 with unsteadiness.  Found to have acute infarct in right posterolateral medulla.  Likely due to large vessel disease from right VA occlusion.  At discharge right upper and lower extremity mild dysmetria.  Lipitor 40 was added.  Referred to home health PT.  3 months DAPT, then aspirin alone. BP looks good 119/70.  Doing fine with Lipitor.  No bruising from antiplatelet or bleeding.  Feels back to normal baseline. 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 Has arthritis in his knees. Still doing home health PT. He drives, is back to mowing the yard. Has 5 adopted children, causing stress in their lives.   -CT head no acute abnormality -CTA head and neck occlusion of the right vertebral artery just distal to its origin.  Remains occluded within the neck.  Retrograde filling of the  right V4 segment which is widely patent without stenosis.  Right ICA up to 50% stenosis. -MRI of the brain acute infarct at the right posterolateral medulla -2D echo EF 60 to 65% -LDL 146 -A1c 5.3 -UDS negative -No antithrombotic prior to admission.  Aspirin 81 and Plavix 75 for 3 months then aspirin 81 mg daily alone given right vertebral artery occlusion -Recommended home health PT  HISTORY  04/30/23 Dr. Amada Jupiter HPI: Brent Norman is a 72 y.o. male who was in his normal state of health until yesterday afternoon at which point he noticed that he was staggering.  He went home and went to sleep, hoping it be better in the morning, but this morning he had to hold onto things to walk and therefore presented to the emergency department.  He denies any diplopia, numbness, weakness, just notes that he is unsteady.     LKW: 1:30 PM 6/23 tnk given?: no, outside the window Premorbid modified rankin scale: Zero  REVIEW OF SYSTEMS: Out of a complete 14 system review of symptoms, the patient complains only of the following symptoms, and all other reviewed systems are negative.  See HPI  ALLERGIES: No Known Allergies  HOME MEDICATIONS: Outpatient Medications Prior to Visit  Medication Sig Dispense Refill   aspirin EC 81 MG tablet Take 1 tablet (81 mg total) by mouth daily. Swallow whole. 180 tablet 0   atorvastatin (LIPITOR) 40 MG tablet Take 1 tablet (40 mg total) by mouth daily. 60 tablet 0   cetirizine (ZYRTEC) 10 MG tablet Take 1 tablet (10  mg total) by mouth daily. (Patient taking differently: Take 10 mg by mouth daily as needed for allergies.) 10 tablet 0   clopidogrel (PLAVIX) 75 MG tablet Take 1 tablet (75 mg total) by mouth daily. 90 tablet 0   Multiple Vitamins-Minerals (MENS 50+ MULTIVITAMIN) TABS Take 1 tablet by mouth daily.     tiZANidine (ZANAFLEX) 4 MG tablet Take 4 mg by mouth at bedtime as needed for muscle spasms.     pantoprazole (PROTONIX) 40 MG tablet Take 1 tablet (40  mg total) by mouth 2 (two) times daily before a meal for 14 days, THEN 1 tablet (40 mg total) daily for 14 days. 42 tablet 0   No facility-administered medications prior to visit.    PAST MEDICAL HISTORY: Past Medical History:  Diagnosis Date   Arthritis    Chronic back pain    DVT (deep venous thrombosis) (HCC)    HTN (hypertension)    Stroke (HCC)     PAST SURGICAL HISTORY: Past Surgical History:  Procedure Laterality Date   APPENDECTOMY      FAMILY HISTORY: Family History  Problem Relation Age of Onset   Cancer Mother    Cancer Father     SOCIAL HISTORY: Social History   Socioeconomic History   Marital status: Married    Spouse name: betty   Number of children: 0   Years of education: Not on file   Highest education level: High school graduate  Occupational History   Not on file  Tobacco Use   Smoking status: Never   Smokeless tobacco: Never  Vaping Use   Vaping status: Never Used  Substance and Sexual Activity   Alcohol use: Never   Drug use: No   Sexual activity: Yes    Birth control/protection: None  Other Topics Concern   Not on file  Social History Narrative   ** Merged History Encounter **       Social Determinants of Health   Financial Resource Strain: Not on file  Food Insecurity: No Food Insecurity (05/01/2023)   Hunger Vital Sign    Worried About Running Out of Food in the Last Year: Never true    Ran Out of Food in the Last Year: Never true  Transportation Needs: No Transportation Needs (05/01/2023)   PRAPARE - Administrator, Civil Service (Medical): No    Lack of Transportation (Non-Medical): No  Physical Activity: Not on file  Stress: Not on file  Social Connections: Unknown (03/23/2023)   Received from [facility]   Social Network    Social Network: Not on file  Intimate Partner Violence: Not At Risk (05/01/2023)   Humiliation, Afraid, Rape, and Kick questionnaire    Fear of Current or Ex-Partner: No    Emotionally  Abused: No    Physically Abused: No    Sexually Abused: No    PHYSICAL EXAM  Vitals:   05/29/23 1016  BP: 119/70  Pulse: 80  Weight: 185 lb 8 oz (84.1 kg)  Height: 5\' 4"  (1.626 m)   Body mass index is 31.84 kg/m.  Generalized: Well developed, in no acute distress  Neurological examination  Mentation: Alert oriented to time, place, history taking. Follows all commands speech and language fluent Cranial nerve II-XII: Pupils were equal round reactive to light. Extraocular movements were full, visual field were full on confrontational test. Facial sensation and strength were normal.  Head turning and shoulder shrug were normal and symmetric. Motor: The motor testing reveals 5 over 5  strength of all 4 extremities. Good symmetric motor tone is noted throughout.  Sensory: Sensory testing is intact to soft touch on all 4 extremities. No evidence of extinction is noted.  Coordination: Cerebellar testing reveals good finger-nose-finger and heel-to-shin bilaterally.  Gait and station: Gait is unsteady, tends to drag the left leg, has walking stick, forward leaning.  Knees are bent. Reflexes: Deep tendon reflexes are symmetric and normal bilaterally.   DIAGNOSTIC DATA (LABS, IMAGING, TESTING) - I reviewed patient records, labs, notes, testing and imaging myself where available.  Lab Results  Component Value Date   WBC 6.0 05/02/2023   HGB 14.1 05/02/2023   HCT 42.2 05/02/2023   MCV 92.5 05/02/2023   PLT 208 05/02/2023      Component Value Date/Time   NA 134 (L) 05/02/2023 0356   K 3.8 05/02/2023 0356   CL 103 05/02/2023 0356   CO2 20 (L) 05/02/2023 0356   GLUCOSE 95 05/02/2023 0356   BUN 17 05/02/2023 0356   CREATININE 1.22 05/02/2023 0356   CREATININE 1.13 10/11/2022 0000   CALCIUM 8.9 05/02/2023 0356   PROT 7.2 02/12/2023 2142   ALBUMIN 3.5 02/12/2023 2142   AST 28 02/12/2023 2142   ALT 44 02/12/2023 2142   ALKPHOS 84 02/12/2023 2142   BILITOT 0.5 02/12/2023 2142    GFRNONAA >60 05/02/2023 0356   GFRAA >60 09/10/2019 0859   Lab Results  Component Value Date   CHOL 209 (H) 05/01/2023   HDL 38 (L) 05/01/2023   LDLCALC 146 (H) 05/01/2023   TRIG 124 05/01/2023   CHOLHDL 5.5 05/01/2023   Lab Results  Component Value Date   HGBA1C 5.3 04/30/2023   No results found for: "VITAMINB12" Lab Results  Component Value Date   TSH 0.398 04/30/2023    Margie Ege, AGNP-C, DNP 05/29/2023, 10:37 AM [facility] [address] [place], [place] [address] [phone]

## 2023-05-29 NOTE — Patient Instructions (Signed)
Stay on aspirin and Plavix until 07/31/23, then stop Plavix, continue aspirin 81 mg daily  Follow up with your primary care doctor, will need Lipid recheck in 2-3 months   Strict management of vascular risk factors with a goal BP less than 130/90, A1c less than 7.0, LDL less than 70 for secondary stroke prevention  Recommend against smoking exposure   Follow up as needed

## 2023-05-31 ENCOUNTER — Emergency Department (HOSPITAL_COMMUNITY)
Admission: EM | Admit: 2023-05-31 | Discharge: 2023-05-31 | Disposition: A | Discharge status: Home / Self Care | Payer: No Typology Code available for payment source | Attending: Emergency Medicine | Admitting: Emergency Medicine

## 2023-05-31 ENCOUNTER — Other Ambulatory Visit: Payer: Self-pay

## 2023-05-31 ENCOUNTER — Emergency Department (HOSPITAL_COMMUNITY): Payer: No Typology Code available for payment source

## 2023-05-31 ENCOUNTER — Encounter (HOSPITAL_COMMUNITY): Payer: Self-pay

## 2023-05-31 DIAGNOSIS — Z7982 Long term (current) use of aspirin: Secondary | ICD-10-CM | POA: Insufficient documentation

## 2023-05-31 DIAGNOSIS — I10 Essential (primary) hypertension: Secondary | ICD-10-CM | POA: Diagnosis not present

## 2023-05-31 DIAGNOSIS — K219 Gastro-esophageal reflux disease without esophagitis: Secondary | ICD-10-CM | POA: Insufficient documentation

## 2023-05-31 DIAGNOSIS — Z7902 Long term (current) use of antithrombotics/antiplatelets: Secondary | ICD-10-CM | POA: Insufficient documentation

## 2023-05-31 DIAGNOSIS — R0602 Shortness of breath: Secondary | ICD-10-CM | POA: Diagnosis present

## 2023-05-31 LAB — BASIC METABOLIC PANEL
Anion gap: 8 (ref 5–15)
BUN: 11 mg/dL (ref 8–23)
CO2: 23 mmol/L (ref 22–32)
Calcium: 9.3 mg/dL (ref 8.9–10.3)
Chloride: 106 mmol/L (ref 98–111)
Creatinine, Ser: 1.07 mg/dL (ref 0.61–1.24)
GFR, Estimated: >60 mL/min (ref >60)
Glucose, Bld: 91 mg/dL (ref 70–99)
Potassium: 3.5 mmol/L (ref 3.5–5.1)
Sodium: 137 mmol/L (ref 135–145)

## 2023-05-31 LAB — CBC
HCT: 41 % (ref 39.0–52.0)
Hemoglobin: 13.5 g/dL (ref 13.0–17.0)
MCH: 29.9 pg (ref 26.0–34.0)
MCHC: 32.9 g/dL (ref 30.0–36.0)
MCV: 90.7 fL (ref 80.0–100.0)
Platelets: 224 10*3/uL (ref 150–400)
RBC: 4.52 MIL/uL (ref 4.22–5.81)
RDW: 12.3 % (ref 11.5–15.5)
WBC: 5.9 10*3/uL (ref 4.0–10.5)
nRBC: 0 % (ref 0.0–0.2)

## 2023-05-31 LAB — TROPONIN I (HIGH SENSITIVITY)
Troponin I (High Sensitivity): 7 ng/L (ref <18)
Troponin I (High Sensitivity): 8 ng/L (ref <18)

## 2023-05-31 MED ORDER — ACETAMINOPHEN 325 MG PO TABS
650.0000 mg | ORAL_TABLET | Freq: Once | ORAL | Status: AC
  Administered 2023-05-31: 650 mg via ORAL
  Filled 2023-05-31: qty 2

## 2023-05-31 MED ORDER — SUCRALFATE 1 GM/10ML PO SUSP
1.0000 g | ORAL | Status: AC
  Administered 2023-05-31: 1 g via ORAL
  Filled 2023-05-31: qty 10

## 2023-05-31 MED ORDER — ALUM & MAG HYDROXIDE-SIMETH 200-200-20 MG/5ML PO SUSP
30.0000 mL | Freq: Once | ORAL | Status: AC
  Administered 2023-05-31: 30 mL via ORAL
  Filled 2023-05-31: qty 30

## 2023-05-31 NOTE — ED Notes (Signed)
Walking O2 maintained 97-99% on room air

## 2023-05-31 NOTE — ED Triage Notes (Signed)
Pt is coming in for shortness of breath " I believe I am taking too many deep breaths ", also mentions that he feels jittery. He states he has no chest pain.

## 2023-05-31 NOTE — Discharge Instructions (Addendum)
It was a pleasure caring for you today.  Cardiac workup was reassuring.  I recommend following up with your primary care provider given your acid reflux symptoms.  Seek emergency care if experiencing any new or worsening symptoms.

## 2023-05-31 NOTE — ED Notes (Signed)
Shift report received, assumed care of patient at this time 

## 2023-05-31 NOTE — ED Provider Notes (Signed)
[place] EMERGENCY DEPARTMENT AT [facility] Provider Note   CSN: [id number] Arrival date & time: 05/31/23  1541     History  Chief Complaint  Patient presents with   Shortness of Breath    Brent Norman is a 72 y.o. male with PMHx HTN, Stroke, chronic back pain, past DVT who presents to ED concerned for increased deep breathing.  Patient was running errands today and felt like he was "taking too many deep breaths". States that it almost feels like indigestion. Also stating that he was mildly woozy and sweaty while running errands today. Also complaining of nasal congestion.   Denies fever, chest pain, cough, abdominal pain, nausea, vomiting, diarrhea.   Shortness of Breath      Home Medications Prior to Admission medications   Medication Sig Start Date End Date Taking? Authorizing Provider  aspirin EC 81 MG tablet Take 1 tablet (81 mg total) by mouth daily. Swallow whole. 05/03/23  Yes Rolly Salter, MD  atorvastatin (LIPITOR) 40 MG tablet Take 1 tablet (40 mg total) by mouth daily. 05/03/23  Yes Rolly Salter, MD  cetirizine (ZYRTEC) 10 MG tablet Take 1 tablet (10 mg total) by mouth daily. Patient taking differently: Take 10 mg by mouth daily as needed for allergies. 01/09/18  Yes Derwood Kaplan, MD  clopidogrel (PLAVIX) 75 MG tablet Take 1 tablet (75 mg total) by mouth daily. 05/03/23 08/01/23 Yes Rolly Salter, MD  Multiple Vitamins-Minerals (MENS 50+ MULTIVITAMIN) TABS Take 1 tablet by mouth daily.   Yes [provider]  tiZANidine (ZANAFLEX) 4 MG tablet Take 4 mg by mouth at bedtime as needed for muscle spasms. 05/16/22  Yes [provider]      Allergies    Patient has no known allergies.    Review of Systems   Review of Systems  Respiratory:  Positive for shortness of breath.     Physical Exam Updated Vital Signs BP 121/81   Pulse 61   Temp 97.6 F (36.4 C) (Tympanic)   Resp 16   Ht 5\' 4"  (1.626 m)   Wt 83.9 kg   SpO2 95%    BMI 31.76 kg/m  Physical Exam Vitals and nursing note reviewed.  Constitutional:      General: He is not in acute distress.    Appearance: He is not ill-appearing, toxic-appearing or diaphoretic.  HENT:     Head: Normocephalic and atraumatic.     Mouth/Throat:     Mouth: Mucous membranes are moist.     Pharynx: No oropharyngeal exudate or posterior oropharyngeal erythema.  Eyes:     General: No scleral icterus.       Right eye: No discharge.        Left eye: No discharge.     Conjunctiva/sclera: Conjunctivae normal.  Cardiovascular:     Rate and Rhythm: Normal rate and regular rhythm.     Pulses: Normal pulses.     Heart sounds: Normal heart sounds. No murmur heard. Pulmonary:     Effort: Pulmonary effort is normal. No tachypnea, accessory muscle usage or respiratory distress.     Breath sounds: Normal breath sounds. No wheezing, rhonchi or rales.     Comments: No respiratory distress or tachypnea. Lungs CTABL. O2 >95% on RA. Abdominal:     Tenderness: There is no abdominal tenderness.  Musculoskeletal:     Right lower leg: No tenderness. No edema.     Left lower leg: No tenderness. No edema.  Skin:  General: Skin is warm and dry.     Capillary Refill: Capillary refill takes less than 2 seconds.     Findings: No rash.  Neurological:     General: No focal deficit present.     Mental Status: He is alert and oriented to person, place, and time. Mental status is at baseline.  Psychiatric:        Mood and Affect: Mood normal.        Behavior: Behavior normal.     ED Results / Procedures / Treatments   Labs (all labs ordered are listed, but only abnormal results are displayed) Labs Reviewed  BASIC METABOLIC PANEL  CBC  TROPONIN I (HIGH SENSITIVITY)  TROPONIN I (HIGH SENSITIVITY)    EKG None  Radiology DG Chest 2 View  Result Date: 05/31/2023 CLINICAL DATA:  COPD, shortness of breath EXAM: CHEST - 2 VIEW COMPARISON:  04/30/2023 FINDINGS: The heart size and  mediastinal contours are within normal limits. Both lungs are clear. The visualized skeletal structures are unremarkable. IMPRESSION: No active cardiopulmonary disease. Electronically Signed   By: Ernie Avena M.D.   On: 05/31/2023 17:14    Procedures Procedures    Medications Ordered in ED Medications  acetaminophen (TYLENOL) tablet 650 mg (has no administration in time range)  sucralfate (CARAFATE) 1 GM/10ML suspension 1 g (1 g Oral Given 05/31/23 2013)  alum & mag hydroxide-simeth (MAALOX/MYLANTA) 200-200-20 MG/5ML suspension 30 mL (30 mLs Oral Given 05/31/23 2024)    ED Course/ Medical Decision Making/ A&P                             Medical Decision Making Amount and/or Complexity of Data Reviewed Labs: ordered. Radiology: ordered.  Risk Prescription drug management.   This patient presents to the ED for concern of increased deep breathing, this involves an extensive number of treatment options, and is a complaint that carries with it a high risk of complications and morbidity.  The differential diagnosis includes Anxiety, Arrhythmia, CHF, Asthma, COPD, PNA, COVID/Flu/RSV, STEMI, Tamponade, TPNX, Sepsis   Co morbidities that complicate the patient evaluation   HTN, Stroke, chronic back pain, hx DVT   Lab Tests:  I Ordered, and personally interpreted labs.  The pertinent results include:   - BMP: no concern for electrolyte abnormality; no concern for kidney damage - Trop: initial and repeat within normal limits - CBC: No concern for anemia or leukocytosis    Imaging Studies ordered:  I ordered imaging studies including  -chest xray: To assess for process contributing to patient's symptoms I independently visualized and interpreted imaging  I agree with the radiologist interpretation   Cardiac Monitoring: / EKG:  The patient was maintained on a cardiac monitor.  I personally viewed and interpreted the cardiac monitored which showed an underlying rhythm of:  No ST changes or arrhythmias    Problem List / ED Course / Critical interventions / Medication management  Patient presents emergency room concern for "increased deep breathing".  Denies chest pain, SOB or cough. During initial interview patient took a deep sigh and stated that this is the symptom that he is complaining of.  Patient mentions that it almost feels like acid reflux.  Patient also endorsing becoming mildly "woozy and sweaty" during errands. Obtaining cardiac workup. Physical exam unremarkable.  Patient without tachycardia, hypotension, or hypoxia. Patient afebrile. Patient maintained oxygen saturation >97% while ambulating in ED. CBC without leukocytosis or anemia.  BMP without electrolyte  abnormalities.  Initial and repeat troponin within normal limits.  EKG reassuring.  Chest x-ray without acute cardiopulmonary disease. Provided patient with GI cocktail which patient states resolved his symptoms. Of note, patient stating that he now has a mild frontal headache on repeat interview. Provided patient with tylenol for HA. I have reviewed the patients home medicines and have made adjustments as needed Patient was given return precautions. Patient stable for discharge at this time. Patient verbalized understanding of plan.  DDx: These are considered less likely due to history of present illness and physical exam findings Arrhythmia: EKG without concern Asthma/COPD/PNA/COVID/Flu/RSV: Lungs clear to auscultation bilaterally and O2 sat 99% STEMI: EKG without concern Tamponade:chest xray without concern CHF: no physical exam findings TPNX: Lungs clear to auscultation bilaterally Sepsis: afebrile and other vital signs stable   Social Determinants of Health:  none           Final Clinical Impression(s) / ED Diagnoses Final diagnoses:  Gastroesophageal reflux disease, unspecified whether esophagitis present    Rx / DC Orders ED Discharge Orders     None          Margarita Rana 05/31/23 2202    Lonell Grandchild, MD 05/31/23 2355

## 2023-06-02 NOTE — Progress Notes (Signed)
I agree with the above plan 

## 2023-09-16 ENCOUNTER — Emergency Department (HOSPITAL_COMMUNITY): Payer: No Typology Code available for payment source

## 2023-09-16 ENCOUNTER — Emergency Department (HOSPITAL_COMMUNITY)
Admission: EM | Admit: 2023-09-16 | Discharge: 2023-09-16 | Disposition: A | Discharge status: Home / Self Care | Payer: No Typology Code available for payment source | Attending: Emergency Medicine | Admitting: Emergency Medicine

## 2023-09-16 ENCOUNTER — Other Ambulatory Visit: Payer: Self-pay

## 2023-09-16 ENCOUNTER — Encounter (HOSPITAL_COMMUNITY): Payer: Self-pay

## 2023-09-16 DIAGNOSIS — I10 Essential (primary) hypertension: Secondary | ICD-10-CM | POA: Insufficient documentation

## 2023-09-16 DIAGNOSIS — K2100 Gastro-esophageal reflux disease with esophagitis, without bleeding: Secondary | ICD-10-CM | POA: Insufficient documentation

## 2023-09-16 DIAGNOSIS — R079 Chest pain, unspecified: Secondary | ICD-10-CM | POA: Diagnosis present

## 2023-09-16 DIAGNOSIS — Z8673 Personal history of transient ischemic attack (TIA), and cerebral infarction without residual deficits: Secondary | ICD-10-CM | POA: Diagnosis not present

## 2023-09-16 DIAGNOSIS — R0789 Other chest pain: Secondary | ICD-10-CM

## 2023-09-16 LAB — CBC
HCT: 40.9 % (ref 39.0–52.0)
Hemoglobin: 13.7 g/dL (ref 13.0–17.0)
MCH: 30.5 pg (ref 26.0–34.0)
MCHC: 33.5 g/dL (ref 30.0–36.0)
MCV: 91.1 fL (ref 80.0–100.0)
Platelets: 202 10*3/uL (ref 150–400)
RBC: 4.49 MIL/uL (ref 4.22–5.81)
RDW: 12.6 % (ref 11.5–15.5)
WBC: 9.4 10*3/uL (ref 4.0–10.5)
nRBC: 0 % (ref 0.0–0.2)

## 2023-09-16 LAB — HEPATIC FUNCTION PANEL
ALT: 43 U/L (ref 0–44)
AST: 26 U/L (ref 15–41)
Albumin: 3.6 g/dL (ref 3.5–5.0)
Alkaline Phosphatase: 106 U/L (ref 38–126)
Bilirubin, Direct: <0.1 mg/dL (ref 0.0–0.2)
Total Bilirubin: 0.9 mg/dL (ref <1.2)
Total Protein: 7.2 g/dL (ref 6.5–8.1)

## 2023-09-16 LAB — BASIC METABOLIC PANEL
Anion gap: 9 (ref 5–15)
BUN: 13 mg/dL (ref 8–23)
CO2: 22 mmol/L (ref 22–32)
Calcium: 9.6 mg/dL (ref 8.9–10.3)
Chloride: 105 mmol/L (ref 98–111)
Creatinine, Ser: 0.94 mg/dL (ref 0.61–1.24)
GFR, Estimated: >60 mL/min (ref >60)
Glucose, Bld: 101 mg/dL — ABNORMAL HIGH (ref 70–99)
Potassium: 4 mmol/L (ref 3.5–5.1)
Sodium: 136 mmol/L (ref 135–145)

## 2023-09-16 LAB — TROPONIN I (HIGH SENSITIVITY)
Troponin I (High Sensitivity): 6 ng/L (ref <18)
Troponin I (High Sensitivity): 6 ng/L (ref <18)

## 2023-09-16 LAB — LIPASE, BLOOD: Lipase: 26 U/L (ref 11–51)

## 2023-09-16 MED ORDER — ALUM & MAG HYDROXIDE-SIMETH 200-200-20 MG/5ML PO SUSP
15.0000 mL | Freq: Once | ORAL | Status: AC
  Administered 2023-09-16: 15 mL via ORAL
  Filled 2023-09-16: qty 30

## 2023-09-16 MED ORDER — SUCRALFATE 1 G PO TABS: 1.0000 g | ORAL_TABLET | Freq: Three times a day (TID) | ORAL | 0 refills | Status: AC

## 2023-09-16 MED ORDER — PANTOPRAZOLE SODIUM 40 MG PO TBEC: 40.0000 mg | DELAYED_RELEASE_TABLET | Freq: Every day | ORAL | 0 refills | Status: AC

## 2023-09-16 MED ORDER — ONDANSETRON HCL 4 MG/2ML IJ SOLN
4.0000 mg | Freq: Once | INTRAMUSCULAR | Status: AC
  Administered 2023-09-16: 4 mg via INTRAVENOUS
  Filled 2023-09-16: qty 2

## 2023-09-16 NOTE — ED Provider Notes (Signed)
Emergency Department Provider Note   I have reviewed the triage vital signs and the nursing notes.   HISTORY  Chief Complaint Gastroesophageal Reflux, Hand Pain, and Leg Pain   HPI Brent Norman is a 72 y.o. male past history of prior stroke and hypertension presents emergency department with reflux-like pain in the throat.  Patient describes a sensation of burning discomfort in his upper chest and throat with associated nausea.  Symptoms been present intermittently since last night.  He states in the past has had this pain and took Tums, with some relief, although last night when he took this medicine he continued to have pain.  No diaphoresis.  No fevers or chills. No shortness of breath.  He also notes a small area of swelling to the dorsum of the right hand.  Denies any injury.  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  No bruising over drainage, fever.   Past Medical History:  Diagnosis Date   Arthritis    Chronic back pain    DVT (deep venous thrombosis) (HCC)    HTN (hypertension)    Stroke (HCC)     Review of Systems  Constitutional: No fever/chills ENT: Mild burning sore throat.  Cardiovascular: Positive chest pain. Respiratory: Denies shortness of breath. Gastrointestinal: No abdominal pain.  No nausea, no vomiting.  No diarrhea. Musculoskeletal: Negative for back pain. Skin: Negative for rash. Area of swelling to the back of the right hand.  Neurological: Negative for headaches, focal weakness or numbness.   ____________________________________________   PHYSICAL EXAM:  VITAL SIGNS: ED Triage Vitals  Encounter Vitals Group     BP 09/16/23 1203 110/73     Pulse Rate 09/16/23 1203 65     Resp 09/16/23 1203 17     Temp 09/16/23 1203 98.2 F (36.8 C)     Temp src --      SpO2 09/16/23 1203 99 %     Weight 09/16/23 1206 185 lb (83.9 kg)     Height 09/16/23 1206 5\' 4"  (1.626 m)   Constitutional: Alert and  oriented. Well appearing and in no acute distress. Eyes: Conjunctivae are normal.  Head: Atraumatic. Nose: No congestion/rhinnorhea. Mouth/Throat: Mucous membranes are moist.  Neck: No stridor.   Cardiovascular: Normal rate, regular rhythm. Good peripheral circulation. Grossly normal heart sounds.   Respiratory: Normal respiratory effort.  No retractions. Lungs CTAB. Gastrointestinal: Soft and nontender. No distention.  Musculoskeletal: No lower extremity tenderness nor edema. No gross deformities of extremities. Neurologic:  Normal speech and language. No gross focal neurologic deficits are appreciated.  Skin:  Skin is warm, dry and intact. No rash noted. Approximately 2 cm circular area of swelling to the dorsal aspect of the right hand. No erythema, induration, and ecchymosis.    ____________________________________________   LABS (all labs ordered are listed, but only abnormal results are displayed)  Labs Reviewed  BASIC METABOLIC PANEL - Abnormal; Notable for the following components:      Result Value   Glucose, Bld 101 (*)    All other components within normal limits  CBC  HEPATIC FUNCTION PANEL  LIPASE, BLOOD  TROPONIN I (HIGH SENSITIVITY)  TROPONIN I (HIGH SENSITIVITY)   ____________________________________________  EKG   EKG Interpretation Date/Time:  Sunday September 16 2023 11:50:00 EST Ventricular Rate:  67 PR Interval:  176 QRS Duration:  84 QT Interval:  414 QTC Calculation: 437 R Axis:   -16  Text Interpretation: Sinus rhythm  with Premature atrial complexes Otherwise normal ECG When compared with ECG of 31-May-2023 21:05, PREVIOUS ECG IS PRESENT Confirmed by Alona Bene [id number]) on 09/16/2023 2:28:01 PM        ____________________________________________  RADIOLOGY  DG Chest 2 View  Result Date: 09/16/2023 CLINICAL DATA:  Epigastric pain. EXAM: CHEST - 2 VIEW COMPARISON:  Chest radiograph dated 05/31/2023. FINDINGS: The heart size and mediastinal  contours are within normal limits. Both lungs are clear. Degenerative changes are seen in the spine. IMPRESSION: No active cardiopulmonary disease. Electronically Signed   By: Romona Curls M.D.   On: 09/16/2023 13:32    ____________________________________________   PROCEDURES  Procedure(s) performed:   Procedures  None  ____________________________________________   INITIAL IMPRESSION / ASSESSMENT AND PLAN / ED COURSE  Pertinent labs & imaging results that were available during my care of the patient were reviewed by me and considered in my medical decision making (see chart for details).   This patient is Presenting for Evaluation of chest pain, which does require a range of treatment options, and is a complaint that involves a high risk of morbidity and mortality.  The Differential Diagnoses includes but is not exclusive to acute coronary syndrome, aortic dissection, pulmonary embolism, cardiac tamponade, community-acquired pneumonia, pericarditis, musculoskeletal chest wall pain, etc.   Critical Interventions-    Medications  alum & mag hydroxide-simeth (MAALOX/MYLANTA) 200-200-20 MG/5ML suspension 15 mL (15 mLs Oral Given 09/16/23 1325)  ondansetron (ZOFRAN) injection 4 mg (4 mg Intravenous Given 09/16/23 1342)  alum & mag hydroxide-simeth (MAALOX/MYLANTA) 200-200-20 MG/5ML suspension 15 mL (15 mLs Oral Given 09/16/23 1617)    Reassessment after intervention:  Feeling improved and tolerating PO.    Clinical Laboratory Tests Ordered, included troponin negative.  LFTs and bilirubin normal.  Lipase negative.  No acute kidney injury.  No leukocytosis on CBC.  Radiologic Tests Ordered, included CXR. I independently interpreted the images and agree with radiology interpretation.   Cardiac Monitor Tracing which shows NSR.    Social Determinants of Health Risk patient is a non-smoker.   Medical Decision Making: Summary:  Patient presents emergency department with burning  pain in his throat and upper chest.  Seems GI in nature but given age and risk factors plan for ACS workup.  Will give Maalox and Zofran here as well. Non-tender abdomen. Defer abdominal imaging for now.   Reevaluation with update and discussion with patient. Symptoms improved. Troponin negative x 2. Considered admit but workup is reassuring and symptoms improved. Stable for discharge.   Patient's presentation is most consistent with acute presentation with potential threat to life or bodily function.   Disposition: discharge  ____________________________________________  FINAL CLINICAL IMPRESSION(S) / ED DIAGNOSES  Final diagnoses:  Atypical chest pain  Gastroesophageal reflux disease with esophagitis without hemorrhage     NEW OUTPATIENT MEDICATIONS STARTED DURING THIS VISIT:  Discharge Medication List as of 09/16/2023  4:14 PM     START taking these medications   Details  pantoprazole (PROTONIX) 40 MG tablet Take 1 tablet (40 mg total) by mouth daily., Starting Sun 09/16/2023, Normal    sucralfate (CARAFATE) 1 g tablet Take 1 tablet (1 g total) by mouth 4 (four) times daily -  with meals and at bedtime for 7 days., Starting Sun 09/16/2023, Until Sun 09/23/2023, Normal        Note:  This document was prepared using Dragon voice recognition software and may include unintentional dictation errors.  Alona Bene, MD, [facility] Emergency [facility]    Maryan Sivak, Ivin Booty  Reece Agar, MD 09/17/23 [phone]

## 2023-09-16 NOTE — ED Triage Notes (Signed)
Patient presents with multiple complaints.  First complaint is hiccups and indigestion since 7pm last night.  Described as burning sensation.  Second complaint is pain in right hand with knot on posterior portion.  Third complaint is pain in right leg when bending it or trying to get in the bed. Patient walks with a cane

## 2023-09-16 NOTE — Discharge Instructions (Signed)
